# Patient Record
Sex: Female | Born: 1985 | Race: White | Hispanic: No | Marital: Single | Smoking: Current every day smoker
Health system: Southern US, Community
[De-identification: ages and names within clinical notes are randomized; demographics above are authoritative.]

## PROBLEM LIST (undated history)

## (undated) DIAGNOSIS — F32A Depression, unspecified: Secondary | ICD-10-CM

## (undated) DIAGNOSIS — A6000 Herpesviral infection of urogenital system, unspecified: Secondary | ICD-10-CM

## (undated) DIAGNOSIS — F419 Anxiety disorder, unspecified: Secondary | ICD-10-CM

## (undated) DIAGNOSIS — E66813 Obesity, class 3: Secondary | ICD-10-CM

## (undated) DIAGNOSIS — R7303 Prediabetes: Secondary | ICD-10-CM

## (undated) DIAGNOSIS — E282 Polycystic ovarian syndrome: Secondary | ICD-10-CM

## (undated) HISTORY — DX: Polycystic ovarian syndrome: E28.2

## (undated) HISTORY — PX: KNEE SURGERY: SHX244

## (undated) HISTORY — PX: NO PAST SURGERIES: SHX2092

## (undated) HISTORY — DX: Prediabetes: R73.03

## (undated) HISTORY — DX: Morbid (severe) obesity due to excess calories: E66.01

## (undated) HISTORY — DX: Obesity, class 3: E66.813

---
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
## 2007-10-21 ENCOUNTER — Emergency Department: Payer: Self-pay | Admitting: Emergency Medicine

## 2008-06-22 ENCOUNTER — Emergency Department: Payer: Self-pay

## 2008-07-07 ENCOUNTER — Emergency Department: Payer: Self-pay | Admitting: Emergency Medicine

## 2010-03-27 ENCOUNTER — Emergency Department: Payer: Self-pay | Admitting: Emergency Medicine

## 2010-09-27 ENCOUNTER — Emergency Department: Payer: Self-pay | Admitting: Emergency Medicine

## 2011-01-09 ENCOUNTER — Emergency Department: Payer: Self-pay | Admitting: Unknown Physician Specialty

## 2011-03-01 ENCOUNTER — Emergency Department: Payer: Self-pay | Admitting: Emergency Medicine

## 2011-06-12 ENCOUNTER — Emergency Department: Payer: Self-pay | Admitting: Internal Medicine

## 2011-10-21 ENCOUNTER — Emergency Department: Payer: Self-pay | Admitting: *Deleted

## 2012-03-25 ENCOUNTER — Emergency Department: Payer: Self-pay | Admitting: Emergency Medicine

## 2012-06-20 ENCOUNTER — Emergency Department: Payer: Self-pay | Admitting: Emergency Medicine

## 2013-02-25 ENCOUNTER — Ambulatory Visit: Payer: Self-pay | Admitting: Unknown Physician Specialty

## 2013-11-05 ENCOUNTER — Ambulatory Visit: Payer: Self-pay | Admitting: Physician Assistant

## 2013-11-10 ENCOUNTER — Emergency Department: Payer: Self-pay | Admitting: Internal Medicine

## 2013-11-10 LAB — URINALYSIS, COMPLETE
BILIRUBIN, UR: NEGATIVE
Glucose,UR: NEGATIVE mg/dL (ref 0–75)
KETONE: NEGATIVE
Nitrite: NEGATIVE
PH: 6 (ref 4.5–8.0)
PROTEIN: NEGATIVE
RBC,UR: 951 /HPF (ref 0–5)
Specific Gravity: 1.023 (ref 1.003–1.030)

## 2014-05-31 ENCOUNTER — Ambulatory Visit: Payer: Self-pay | Admitting: Emergency Medicine

## 2015-01-04 ENCOUNTER — Emergency Department
Admit: 2015-01-04 | Disposition: A | Discharge status: Home / Self Care | Payer: Self-pay | Admitting: Emergency Medicine

## 2015-02-25 ENCOUNTER — Emergency Department
Admission: EM | Admit: 2015-02-25 | Discharge: 2015-02-25 | Disposition: A | Discharge status: Home / Self Care | Payer: BLUE CROSS/BLUE SHIELD | Attending: Emergency Medicine | Admitting: Emergency Medicine

## 2015-02-25 ENCOUNTER — Encounter: Payer: Self-pay | Admitting: Emergency Medicine

## 2015-02-25 ENCOUNTER — Emergency Department: Payer: BLUE CROSS/BLUE SHIELD

## 2015-02-25 DIAGNOSIS — Z720 Tobacco use: Secondary | ICD-10-CM | POA: Insufficient documentation

## 2015-02-25 DIAGNOSIS — M25562 Pain in left knee: Secondary | ICD-10-CM | POA: Insufficient documentation

## 2015-02-25 MED ORDER — NAPROXEN 500 MG PO TABS: 500.0000 mg | ORAL_TABLET | Freq: Two times a day (BID) | ORAL | Status: AC

## 2015-02-25 NOTE — Discharge Instructions (Signed)

## 2015-02-25 NOTE — ED Provider Notes (Signed)
CSN: [id number]     Arrival date & time 02/25/15  1216 History   First MD Initiated Contact with Patient 02/25/15 1232     Chief Complaint  Patient presents with  . Knee Pain    L knee pain x 3 days, hx same, no new injury     HPI Comments: 29yo female presents today complaining of left knee pain for the past 3 days. She reports she has a recurrent problem with knee pain over the past 3 years. She has been evaluated in our emergency Department for the same in the past. She has not had a new injury. In fact, she has never had an injury. She has had some mild swelling. She has not tried any over-the-counter medications for her pain.  Patient is a 29 y.o. female presenting with knee pain. The history is provided by the patient.  Knee Pain Location:  Knee Time since incident:  3 days Knee location:  L knee Pain details:    Quality:  Aching   Severity:  Moderate   Onset quality:  Gradual   Timing:  Constant   Progression:  Worsening Chronicity:  Recurrent Dislocation: no   Foreign body present:  No foreign bodies Tetanus status:  Up to date Prior injury to area:  No Relieved by:  None tried Worsened by:  Bearing weight, activity and exercise Ineffective treatments:  None tried Associated symptoms: decreased ROM, stiffness and swelling   Associated symptoms: no fever and no numbness   Risk factors: obesity     History reviewed. No pertinent past medical history. No past surgical history on file. No family history on file. History  Substance Use Topics  . Smoking status: Current Every Day Smoker -- 1.00 packs/day  . Smokeless tobacco: Not on file  . Alcohol Use: No   OB History    No data available     Review of Systems  Constitutional: Negative for fever and chills.  Musculoskeletal: Positive for myalgias, joint swelling, arthralgias, gait problem and stiffness.  Skin: Negative for wound.  Neurological: Negative for weakness and numbness.  All other systems reviewed and  are negative.     Allergies  Review of patient's allergies indicates not on file.  Home Medications   Prior to Admission medications   Medication Sig Start Date End Date Taking? Authorizing Provider  naproxen (NAPROSYN) 500 MG tablet Take 1 tablet (500 mg total) by mouth 2 (two) times daily with a meal. 02/25/15 02/25/16  Wilber Oliphant V, PA-C   BP 114/81 mmHg  Pulse 95  Temp(Src) 98.1 F (36.7 C) (Oral)  Resp 20  Ht  (1.575 m)  Wt 245 lb (111.131 kg)  BMI 44.80 kg/m2  SpO2 96%  LMP  (Within Months) Physical Exam  Constitutional: She is oriented to person, place, and time. She appears well-developed and well-nourished.  Morbidly obese  HENT:  Head: Normocephalic and atraumatic.  Musculoskeletal: She exhibits tenderness.       Left knee: She exhibits decreased range of motion, swelling and bony tenderness. She exhibits no effusion, no deformity, no erythema, no LCL laxity, normal meniscus and no MCL laxity. Tenderness found. Medial joint line tenderness noted.  Stable to varus and valgus stress. Negative anterior and posterior drawer. Negative McMurray's test.  Neurological: She is alert and oriented to person, place, and time. She has normal strength. No sensory deficit.  Skin: Skin is warm, dry and intact. No rash noted.  Psychiatric: She has a normal mood and affect.  Her behavior is normal. Judgment and thought content normal.  Nursing note and vitals reviewed.   ED Course  Procedures (including critical care time) Labs Review Labs Reviewed - No data to display  Imaging Review Dg Knee Complete 4 Views Left  02/25/2015   CLINICAL DATA:  Remote left knee injury approximately 3 years ago, now presenting with 3 day history of left knee pain when standing or walking. No recent injuries. Pain localizing anteriorly and laterally.  EXAM: LEFT KNEE - COMPLETE 4+ VIEW  COMPARISON:  03/25/2012.  FINDINGS: No evidence of acute fracture or dislocation. Well-preserved joint spaces.  Well-preserved bone mineral density. No intrinsic osseous abnormality. No visible joint effusion.  IMPRESSION: Normal examination.   Electronically Signed   By: Hulan Saashomas  Ermal Haberer M.D.   On: 02/25/2015 13:35     EKG Interpretation None      MDM  Chronic knee pain. Prescription written for naproxen twice a day. Follow-up for persistent or worsening symptoms with orthopedics. May need MRI for further evaluation given chronicity of symptoms. Patient would greatly benefit from my office. Final diagnoses:  Left knee pain        Luvenia Reddenmma Weavil V, PA-C 02/25/15 1341  Governor Rooksebecca Lord, MD 02/27/15 [id number]

## 2015-02-25 NOTE — ED Notes (Signed)
States has had knee pain x 3 years, recent episode x 3 days without new injury

## 2015-09-11 ENCOUNTER — Ambulatory Visit
Admission: EM | Admit: 2015-09-11 | Discharge: 2015-09-11 | Disposition: A | Discharge status: Home / Self Care | Payer: BLUE CROSS/BLUE SHIELD | Attending: Family Medicine | Admitting: Family Medicine

## 2015-09-11 ENCOUNTER — Encounter: Payer: Self-pay | Admitting: Emergency Medicine

## 2015-09-11 DIAGNOSIS — B9689 Other specified bacterial agents as the cause of diseases classified elsewhere: Secondary | ICD-10-CM

## 2015-09-11 DIAGNOSIS — A499 Bacterial infection, unspecified: Secondary | ICD-10-CM | POA: Diagnosis not present

## 2015-09-11 DIAGNOSIS — N760 Acute vaginitis: Secondary | ICD-10-CM

## 2015-09-11 HISTORY — DX: Herpesviral infection of urogenital system, unspecified: A60.00

## 2015-09-11 HISTORY — DX: Anxiety disorder, unspecified: F41.9

## 2015-09-11 LAB — URINALYSIS COMPLETE WITH MICROSCOPIC (ARMC ONLY)
BACTERIA UA: NONE SEEN
Bilirubin Urine: NEGATIVE
Glucose, UA: NEGATIVE mg/dL
HGB URINE DIPSTICK: NEGATIVE
Ketones, ur: NEGATIVE mg/dL
Nitrite: NEGATIVE
PH: 8.5 — AB (ref 5.0–8.0)
Protein, ur: NEGATIVE mg/dL
RBC / HPF: NONE SEEN RBC/hpf (ref 0–5)
Specific Gravity, Urine: 1.02 (ref 1.005–1.030)
WBC UA: NONE SEEN WBC/hpf (ref 0–5)

## 2015-09-11 LAB — WET PREP, GENITAL
Sperm: NONE SEEN
TRICH WET PREP: NONE SEEN
Yeast Wet Prep HPF POC: NONE SEEN

## 2015-09-11 LAB — PREGNANCY, URINE: Preg Test, Ur: NEGATIVE

## 2015-09-11 MED ORDER — METRONIDAZOLE 500 MG PO TABS: 500.0000 mg | ORAL_TABLET | Freq: Two times a day (BID) | ORAL | Status: DC

## 2015-09-11 NOTE — ED Notes (Signed)
Patient c/o smelling odor and vaginal discharge for 2 weeks.  Patient denies abdominal pain.

## 2015-09-11 NOTE — Discharge Instructions (Signed)
Take medication as prescribed. Drink plenty of fluids. Follow up with your primary care physician or the above this week as needed.Return to Urgent care as needed for new or worsening concerns.   Bacterial Vaginosis Bacterial vaginosis is a vaginal infection that occurs when the normal balance of bacteria in the vagina is disrupted. It results from an overgrowth of certain bacteria. This is the most common vaginal infection in women of childbearing age. Treatment is important to prevent complications, especially in pregnant women, as it can cause a premature delivery. CAUSES  Bacterial vaginosis is caused by an increase in harmful bacteria that are normally present in smaller amounts in the vagina. Several different kinds of bacteria can cause bacterial vaginosis. However, the reason that the condition develops is not fully understood. RISK FACTORS Certain activities or behaviors can put you at an increased risk of developing bacterial vaginosis, including:  Having a new sex partner or multiple sex partners.  Douching.  Using an intrauterine device (IUD) for contraception. Women do not get bacterial vaginosis from toilet seats, bedding, swimming pools, or contact with objects around them. SIGNS AND SYMPTOMS  Some women with bacterial vaginosis have no signs or symptoms. Common symptoms include:  Grey vaginal discharge.  A fishlike odor with discharge, especially after sexual intercourse.  Itching or burning of the vagina and vulva.  Burning or pain with urination. DIAGNOSIS  Your health care provider will take a medical history and examine the vagina for signs of bacterial vaginosis. A sample of vaginal fluid may be taken. Your health care provider will look at this sample under a microscope to check for bacteria and abnormal cells. A vaginal pH test may also be done.  TREATMENT  Bacterial vaginosis may be treated with antibiotic medicines. These may be given in the form of a pill or a  vaginal cream. A second round of antibiotics may be prescribed if the condition comes back after treatment. Because bacterial vaginosis increases your risk for sexually transmitted diseases, getting treated can help reduce your risk for chlamydia, gonorrhea, HIV, and herpes. HOME CARE INSTRUCTIONS   Only take over-the-counter or prescription medicines as directed by your health care provider.  If antibiotic medicine was prescribed, take it as directed. Make sure you finish it even if you start to feel better.  Tell all sexual partners that you have a vaginal infection. They should see their health care provider and be treated if they have problems, such as a mild rash or itching.  During treatment, it is important that you follow these instructions:  Avoid sexual activity or use condoms correctly.  Do not douche.  Avoid alcohol as directed by your health care provider.  Avoid breastfeeding as directed by your health care provider. SEEK MEDICAL CARE IF:   Your symptoms are not improving after 3 days of treatment.  You have increased discharge or pain.  You have a fever. MAKE SURE YOU:   Understand these instructions.  Will watch your condition.  Will get help right away if you are not doing well or get worse. FOR MORE INFORMATION  Centers for Disease Control and Prevention, Division of STD Prevention: SolutionApps.co.zawww.cdc.gov/std American Sexual Health Association (ASHA): www.ashastd.org    This information is not intended to replace advice given to you by your health care provider. Make sure you discuss any questions you have with your health care provider.   Document Released: 09/23/2005 Document Revised: 10/14/2014 Document Reviewed: 05/05/2013 Elsevier Interactive Patient Education [employer]2016 Elsevier Inc.

## 2015-09-11 NOTE — ED Provider Notes (Signed)
[facility]  ____________________________________________  Time seen: Approximately 4:46 PM  I have reviewed the triage vital signs and the nursing notes.   HISTORY  Chief Complaint Vaginal Discharge   HPI Amber Keller is a 29 y.o. female  presents for the complaints of 2 weeks of vaginal discharge. Patient reports occasional odor with vaginal discharge and greenish coloration. Patient reports that she is currently sexually active with one partner, and denies recent partner changes. Patient denies concern for sexual transmitted diseases.  States that she does not use sexual protection and states she is okay if she becomes pregnant.  Denies abdominal pain, fever, vaginal pain, back pain, abnormal vaginal bleeding or other complaints. Denies other complaints.   Past Medical History  Diagnosis Date  . Genital herpes   . Anxiety     There are no active problems to display for this patient.   History reviewed. No pertinent past surgical history.  Current Outpatient Rx  Name  Route  Sig  Dispense  Refill  . acyclovir (ZOVIRAX) 400 MG tablet   Oral   Take 400 mg by mouth as needed.         Marland Kitchen. PARoxetine (PAXIL) 10 MG tablet   Oral   Take 10 mg by mouth daily.         . naproxen (NAPROSYN) 500 MG tablet   Oral   Take 1 tablet (500 mg total) by mouth 2 (two) times daily with a meal.   60 tablet   0     Allergies Review of patient's allergies indicates no known allergies.  History reviewed. No pertinent family history.  Social History Social History  Substance Use Topics  . Smoking status: Current Every Day Smoker -- 1.00 packs/day  . Smokeless tobacco: None  . Alcohol Use: No    Review of Systems Constitutional: No fever/chills Eyes: No visual changes. ENT: No sore throat. Cardiovascular: Denies chest pain. Respiratory: Denies shortness of breath. Gastrointestinal: No abdominal pain.  No nausea, no vomiting.  No diarrhea.  No  constipation. Genitourinary: Negative for dysuria. Positive vaginal discharge. Musculoskeletal: Negative for back pain. Skin: Negative for rash. Neurological: Negative for headaches, focal weakness or numbness.  10-point ROS otherwise negative.  ____________________________________________   PHYSICAL EXAM:  VITAL SIGNS: ED Triage Vitals  Enc Vitals Group     BP 09/11/15 1539 127/80 mmHg     Pulse Rate 09/11/15 1539 70     Resp 09/11/15 1539 16     Temp 09/11/15 1539 98.3 F (36.8 C)     Temp Source 09/11/15 1539 Tympanic     SpO2 09/11/15 1539 99 %     Weight 09/11/15 1539 350 lb (158.759 kg)     Height 09/11/15 1539 5\' 2"  (1.575 m)     Head Cir --      Peak Flow --      Pain Score --      Pain Loc --      Pain Edu? --      Excl. in GC? --     Constitutional: Alert and oriented. Well appearing and in no acute distress. Eyes: Conjunctivae are normal. PERRL. EOMI. Head: Atraumatic.  Nose: No congestion/rhinnorhea.  Mouth/Throat: Mucous membranes are moist.  Oropharynx non-erythematous. Neck: No stridor.  No cervical spine tenderness to palpation. Hematological/Lymphatic/Immunilogical: No cervical lymphadenopathy. Cardiovascular: Normal rate, regular rhythm. Grossly normal heart sounds.  Good peripheral circulation. Respiratory: Normal respiratory effort.  No retractions. Lungs CTAB. Gastrointestinal: Soft and nontender. No distention.  Normal Bowel sounds.  No abdominal bruits. No CVA tenderness. Pelvic exam completed with Heather RN at bedside. External: Normal appearance, no rash or lesions. Speculum: Mild to moderate amount of greenish mucousy discharge, cervix os closed, no active vaginal bleeding and no blood noted in vaginal vault. Bimanual: No cervical or adnexal tenderness, nontender.  Musculoskeletal: No lower or upper extremity tenderness nor edema.  No joint effusions. Bilateral pedal pulses equal and easily palpated.  Neurologic:  Normal speech and language. No  gross focal neurologic deficits are appreciated. No gait instability. Skin:  Skin is warm, dry and intact. No rash noted. Psychiatric: Mood and affect are normal. Speech and behavior are normal.  ____________________________________________   LABS (all labs ordered are listed, but only abnormal results are displayed)  Labs Reviewed  WET PREP, GENITAL - Abnormal; Notable for the following:    Clue Cells Wet Prep HPF POC FEW (*)    WBC, Wet Prep HPF POC MODERATE (*)    All other components within normal limits  URINALYSIS COMPLETEWITH MICROSCOPIC ([facility] ONLY) - Abnormal; Notable for the following:    APPearance TURBID (*)    pH 8.5 (*)    Leukocytes, UA TRACE (*)    Squamous Epithelial / LPF 6-30 (*)    All other components within normal limits  CHLAMYDIA/NGC RT PCR ([facility] ONLY)  URINE CULTURE  PREGNANCY, URINE     INITIAL IMPRESSION / ASSESSMENT AND PLAN / ED COURSE  Pertinent labs & imaging results that were available during my care of the patient were reviewed by me and considered in my medical decision making (see chart for details).  Well-appearing patient. No acute distress. Presents for complaints of 2 weeks of vaginal discharge. Denies other complaints. Denies known trigger. Reports sexual active with 1 partner. Urinalysis negative for bacteria, trace leukocytes, will culture urine. Wet prep completed as well as gonorrhea chlamydia swabs obtained.  Wet prep positive for clue cells, and wbcs. Will treat bacterial vaginosis with oral flagyl and PCP follow up . OBGYN follow up information also given for OBGYN on call Dr Valentino Saxon. Discussed follow up with Primary care physician this week. Discussed follow up and return parameters including no resolution or any worsening concerns. Patient verbalized understanding and agreed to plan.   ____________________________________________    FINAL CLINICAL IMPRESSION(S) / ED DIAGNOSES  Final diagnoses:  Bacterial vaginosis        Renford Dills, NP 09/11/15 1711

## 2015-09-12 LAB — CHLAMYDIA/NGC RT PCR (ARMC ONLY)
CHLAMYDIA TR: NOT DETECTED
N gonorrhoeae: NOT DETECTED

## 2015-09-13 LAB — URINE CULTURE

## 2015-10-05 ENCOUNTER — Encounter: Payer: Self-pay | Admitting: Emergency Medicine

## 2015-10-05 ENCOUNTER — Ambulatory Visit (INDEPENDENT_AMBULATORY_CARE_PROVIDER_SITE_OTHER): Payer: BLUE CROSS/BLUE SHIELD

## 2015-10-05 ENCOUNTER — Ambulatory Visit
Admission: EM | Admit: 2015-10-05 | Discharge: 2015-10-05 | Disposition: A | Discharge status: Home / Self Care | Payer: BLUE CROSS/BLUE SHIELD | Attending: Family Medicine | Admitting: Family Medicine

## 2015-10-05 DIAGNOSIS — J3489 Other specified disorders of nose and nasal sinuses: Secondary | ICD-10-CM

## 2015-10-05 DIAGNOSIS — J40 Bronchitis, not specified as acute or chronic: Secondary | ICD-10-CM | POA: Diagnosis not present

## 2015-10-05 MED ORDER — FEXOFENADINE-PSEUDOEPHED ER 180-240 MG PO TB24: 1.0000 | ORAL_TABLET | Freq: Every day | ORAL | Status: DC

## 2015-10-05 MED ORDER — FLUTICASONE PROPIONATE 50 MCG/ACT NA SUSP: 2.0000 | Freq: Every day | NASAL | Status: DC

## 2015-10-05 MED ORDER — AMOXICILLIN-POT CLAVULANATE 875-125 MG PO TABS: 1.0000 | ORAL_TABLET | Freq: Two times a day (BID) | ORAL | Status: DC

## 2015-10-05 MED ORDER — HYDROCOD POLST-CPM POLST ER 10-8 MG/5ML PO SUER: 5.0000 mL | Freq: Two times a day (BID) | ORAL | Status: DC | PRN

## 2015-10-05 NOTE — Discharge Instructions (Signed)

## 2015-10-05 NOTE — ED Provider Notes (Signed)
CSN: [id number]     Arrival date & time 10/05/15  [id number] History   First MD Initiated Contact with Patient 10/05/15 [phone]     Nurses notes were reviewed.  Chief Complaint  Patient presents with  . Cough   Patient reports having a cough for over 3 months. She states the last 3 weeks she's had postnasal drainage is greenish material coming from her nostril. She reports feeling miserable. 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 Question as far as the duration of her illness but she is insistent that she's been coughing for about 3 months and the nasal congestion for at least 3 weeks. She states is greenish thick material that she's coughing up. She states states that while no fever was found today she felt that she has been running a fever. She does not smoke but her father does smoke around her.  In reviewing patient's records she does have a history of smoking after all.  (Consider location/radiation/quality/duration/timing/severity/associated sxs/prior Treatment) Patient is a 29 y.o. female presenting with URI. The history is provided by the patient. No language interpreter was used.  URI Presenting symptoms: cough, ear pain and rhinorrhea   Severity:  Moderate Onset quality:  Sudden Timing:  Constant Progression:  Resolved Chronicity:  New Relieved by:  Breathing Worsened by:  Nothing tried Associated symptoms: no headaches and no sinus pain   Risk factors: no immunosuppression     Past Medical History  Diagnosis Date  . Genital herpes   . Anxiety    History reviewed. No pertinent past surgical history. History reviewed. No pertinent family history. Social History  Substance Use Topics  . Smoking status: Current Every Day Smoker -- 1.00 packs/day  . Smokeless tobacco: None  . Alcohol Use: No   OB History    No data available     Review of Systems  Constitutional: Positive for activity change.  HENT:  Positive for ear pain, postnasal drip, rhinorrhea and voice change.   Respiratory: Positive for cough and shortness of breath.   Neurological: Negative for headaches.  All other systems reviewed and are negative.   Allergies  Review of patient's allergies indicates no known allergies.  Home Medications   Prior to Admission medications   Medication Sig Start Date End Date Taking? Authorizing Provider  acyclovir (ZOVIRAX) 400 MG tablet Take 400 mg by mouth as needed.    Historical Provider, MD  amoxicillin-clavulanate (AUGMENTIN) 875-125 MG tablet Take 1 tablet by mouth 2 (two) times daily. 10/05/15   Hassan RowanEugene Katsumi Wisler, MD  chlorpheniramine-HYDROcodone [facility](TUSSIONEX PENNKINETIC ER) 10-8 MG/5ML SUER Take 5 mLs by mouth every 12 (twelve) hours as needed for cough. 10/05/15   Hassan RowanEugene Emanuel Campos, MD  fexofenadine-pseudoephedrine (ALLEGRA-D ALLERGY & CONGESTION) 180-240 MG 24 hr tablet Take 1 tablet by mouth daily. 10/05/15   Hassan RowanEugene Arvilla Salada, MD  fluticasone (FLONASE) 50 MCG/ACT nasal spray Place 2 sprays into both nostrils daily. 10/05/15   Hassan RowanEugene Lean Jaeger, MD  metroNIDAZOLE (FLAGYL) 500 MG tablet Take 1 tablet (500 mg total) by mouth 2 (two) times daily. 09/11/15   Renford DillsLindsey Miller, NP  naproxen (NAPROSYN) 500 MG tablet Take 1 tablet (500 mg total) by mouth 2 (two) times daily with a meal. 02/25/15 02/25/16  Christella ScheuermannEmma Lawrence V, PA-C  PARoxetine (PAXIL) 10 MG tablet Take 10 mg by mouth daily.    Historical Provider, MD   Meds Ordered and Administered this Visit  Medications - No  data to display  BP 113/72 mmHg  Pulse 74  Temp(Src) 97 F (36.1 C) (Tympanic)  Resp 17  Ht  (1.575 m)  Wt 350 lb (158.759 kg)  BMI 64.00 kg/m2  SpO2 99%  LMP 08/03/2015 (Approximate) No data found.   Physical Exam  Constitutional: She is oriented to person, place, and time. She appears well-developed and well-nourished.  HENT:  Head: Normocephalic and atraumatic.  Right Ear: Hearing, tympanic membrane, external ear and ear canal  normal.  Left Ear: Hearing, tympanic membrane, external ear and ear canal normal.  Nose: Mucosal edema and rhinorrhea present. Right sinus exhibits maxillary sinus tenderness. Left sinus exhibits maxillary sinus tenderness.  Mouth/Throat: Posterior oropharyngeal erythema present.  Eyes: Conjunctivae are normal. Pupils are equal, round, and reactive to light.  Neck: Neck supple. No tracheal deviation present.  Cardiovascular: Normal rate, regular rhythm and normal heart sounds.   Pulmonary/Chest: Effort normal and breath sounds normal. No respiratory distress. She has no wheezes.  Musculoskeletal: Normal range of motion.  Lymphadenopathy:    She has cervical adenopathy.  Neurological: She is alert and oriented to person, place, and time.  Skin: Skin is warm and dry.  Psychiatric: She has a normal mood and affect.  Vitals reviewed.   ED Course  Procedures (including critical care time)  Labs Review Labs Reviewed - No data to display  Imaging Review Dg Chest 2 View  10/05/2015  CLINICAL DATA:  Productive cough with chills for 3 months. EXAM: CHEST  2 VIEW COMPARISON:  None. FINDINGS: The heart size and mediastinal contours are within normal limits. Both lungs are clear. The visualized skeletal structures are unremarkable. IMPRESSION: No active cardiopulmonary disease. Electronically Signed   By: Charlett Nose M.D.   On: 10/05/2015 10:48     Visual Acuity Review  Right Eye Distance:   Left Eye Distance:   Bilateral Distance:    Right Eye Near:   Left Eye Near:    Bilateral Near:         MDM   1. Bronchitis   2. Nasal and sinus discharge    At this time chest x-rays negative. Concerned because of her severity of symptoms that she's complained about. Physically she does not look bad. Will give her work note for today. 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 This hopefully will allow her to get some rest and sleep. Please see her PCP if not better in 1-2 weeks. She will be sent home on Tussionex, Allegra-D, Flonase nasal spray, and Augmentin.  The discrepancy was also noted in regards to her smoking history.    Hassan Rowan, MD 10/05/15 [phone]

## 2015-10-05 NOTE — ED Notes (Signed)
Patient cough and chest congestion for 3 months.

## 2016-08-23 ENCOUNTER — Emergency Department
Admission: EM | Admit: 2016-08-23 | Discharge: 2016-08-23 | Disposition: A | Discharge status: Home / Self Care | Payer: Managed Care, Other (non HMO) | Attending: Emergency Medicine | Admitting: Emergency Medicine

## 2016-08-23 ENCOUNTER — Encounter: Payer: Self-pay | Admitting: Emergency Medicine

## 2016-08-23 DIAGNOSIS — F17200 Nicotine dependence, unspecified, uncomplicated: Secondary | ICD-10-CM | POA: Insufficient documentation

## 2016-08-23 DIAGNOSIS — F419 Anxiety disorder, unspecified: Secondary | ICD-10-CM | POA: Diagnosis not present

## 2016-08-23 DIAGNOSIS — M79672 Pain in left foot: Secondary | ICD-10-CM

## 2016-08-23 MED ORDER — TRAMADOL HCL 50 MG PO TABS: 50.0000 mg | ORAL_TABLET | Freq: Four times a day (QID) | ORAL | 0 refills | Status: DC | PRN

## 2016-08-23 NOTE — ED Provider Notes (Signed)
[facility] Emergency Department Provider Note   ____________________________________________   First MD Initiated Contact with Patient 08/23/16 1116     (approximate)  I have reviewed the triage vital signs and the nursing notes.   HISTORY  Chief Complaint Foot Pain    HPI Amber Zenaida Nieceicole Keller is a 30 y.o. female patient complaining left dorsal foot pain for 2 weeks which has increased in the last few days.. Patient denies any injury or provocative incident for her complaint. Patient states she does have a history of plantar fasciitis with heel spurs. Patient states she currently takes naproxen on an as-needed basis for her plantar fasciitis. Patient is rating her pain as a 8/10. Patient describes the pain as achy. No other palliative measures for her complaint.   Past Medical History:  Diagnosis Date  . Anxiety   . Genital herpes     There are no active problems to display for this patient.   History reviewed. No pertinent surgical history.  Prior to Admission medications   Medication Sig Start Date End Date Taking? Authorizing Provider  acyclovir (ZOVIRAX) 400 MG tablet Take 400 mg by mouth as needed.    Historical Provider, MD  amoxicillin-clavulanate (AUGMENTIN) 875-125 MG tablet Take 1 tablet by mouth 2 (two) times daily. 10/05/15   Hassan RowanEugene Wade, MD  chlorpheniramine-HYDROcodone [facility](TUSSIONEX PENNKINETIC ER) 10-8 MG/5ML SUER Take 5 mLs by mouth every 12 (twelve) hours as needed for cough. 10/05/15   Hassan RowanEugene Wade, MD  fexofenadine-pseudoephedrine (ALLEGRA-D ALLERGY & CONGESTION) 180-240 MG 24 hr tablet Take 1 tablet by mouth daily. 10/05/15   Hassan RowanEugene Wade, MD  fluticasone (FLONASE) 50 MCG/ACT nasal spray Place 2 sprays into both nostrils daily. 10/05/15   Hassan RowanEugene Wade, MD  metroNIDAZOLE (FLAGYL) 500 MG tablet Take 1 tablet (500 mg total) by mouth 2 (two) times daily. 09/11/15   Renford DillsLindsey Miller, NP  PARoxetine (PAXIL) 10 MG tablet Take 10 mg by mouth  daily.    Historical Provider, MD  traMADol (ULTRAM) 50 MG tablet Take 1 tablet (50 mg total) by mouth every 6 (six) hours as needed for moderate pain. 08/23/16   Joni Reiningonald K Evalene Vath, PA-C    Allergies Patient has no known allergies.  No family history on file.  Social History Social History  Substance Use Topics  . Smoking status: Current Every Day Smoker    Packs/day: 1.00  . Smokeless tobacco: Not on file  . Alcohol use No    Review of Systems Constitutional: No fever/chills.  Eyes: No visual changes. ENT: No sore throat. Cardiovascular: Denies chest pain. Respiratory: Denies shortness of breath. Gastrointestinal: No abdominal pain.  No nausea, no vomiting.  No diarrhea.  No constipation. Genitourinary: Negative for dysuria. Musculoskeletal: Negative for back pain. Skin: Negative for rash. Neurological: Negative for headaches, focal weakness or numbness. Psychiatric:Anxiety  ____________________________________________   PHYSICAL EXAM:  VITAL SIGNS: ED Triage Vitals [08/23/16 1107]  Enc Vitals Group     BP 122/82     Pulse Rate 83     Resp 18     Temp 97.1 F (36.2 C)     Temp Source Oral     SpO2 97 %     Weight 260 lb (117.9 kg)     Height 5\' 2"  (1.575 m)     Head Circumference      Peak Flow      Pain Score 8     Pain Loc      Pain Edu?  Excl. in GC?     Constitutional: Alert and oriented. Well appearing and in no acute distress. Eyes: Conjunctivae are normal. PERRL. EOMI. Head: Atraumatic. Nose: No congestion/rhinnorhea. Mouth/Throat: Mucous membranes are moist.  Oropharynx non-erythematous. Neck: No stridor.  No cervical spine tenderness to palpation. Hematological/Lymphatic/Immunilogical: No cervical lymphadenopathy. Cardiovascular: Normal rate, regular rhythm. Grossly normal heart sounds.  Good peripheral circulation. Respiratory: Normal respiratory effort.  No retractions. Lungs CTAB. Gastrointestinal: Soft and nontender. No distention. No  abdominal bruits. No CVA tenderness. Musculoskeletal: No obvious deformity to the left foot. There is no edema or erythema. Patient is nontender palpation this time. She has full nuchal range of motion of the foot. Patient weightbears without discomfort.  Neurologic:  Normal speech and language. No gross focal neurologic deficits are appreciated. No gait instability. Skin:  Skin is warm, dry and intact. No rash noted. Psychiatric: Mood and affect are normal. Speech and behavior are normal.  ____________________________________________   LABS (all labs ordered are listed, but only abnormal results are displayed)  Labs Reviewed - No data to display ____________________________________________  EKG   ____________________________________________  RADIOLOGY   ____________________________________________   PROCEDURES  Procedure(s) performed: None  Procedures  Critical Care performed: No  ____________________________________________   INITIAL IMPRESSION / ASSESSMENT AND PLAN / ED COURSE  Pertinent labs & imaging results that were available during my care of the patient were reviewed by me and considered in my medical decision making (see chart for details).  Left foot pain. Patient given discharge Instructions advised follow-up with podiatry for definitive evaluation and treatment. Patient given a work note for today.  Clinical Course      ____________________________________________   FINAL CLINICAL IMPRESSION(S) / ED DIAGNOSES  Final diagnoses:  Foot pain, left      NEW MEDICATIONS STARTED DURING THIS VISIT:  New Prescriptions   TRAMADOL (ULTRAM) 50 MG TABLET    Take 1 tablet (50 mg total) by mouth every 6 (six) hours as needed for moderate pain.     Note:  This document was prepared using Dragon voice recognition software and may include unintentional dictation errors.    Joni Reiningonald K Emric Kowalewski, PA-C 08/23/16 1128    Sharyn CreamerMark Quale, MD 08/23/16 1452

## 2016-08-23 NOTE — ED Triage Notes (Signed)
Pt reports pain to top of left foot for a few days, denies injury.

## 2017-04-02 ENCOUNTER — Encounter: Payer: Self-pay | Admitting: Obstetrics and Gynecology

## 2017-04-21 ENCOUNTER — Encounter: Payer: Self-pay | Admitting: Obstetrics and Gynecology

## 2017-04-21 ENCOUNTER — Ambulatory Visit (INDEPENDENT_AMBULATORY_CARE_PROVIDER_SITE_OTHER): Payer: Managed Care, Other (non HMO) | Admitting: Obstetrics and Gynecology

## 2017-04-21 VITALS — BP 118/82 | HR 82 | Ht 62.0 in | Wt 240.0 lb

## 2017-04-21 DIAGNOSIS — R7303 Prediabetes: Secondary | ICD-10-CM | POA: Diagnosis not present

## 2017-04-21 DIAGNOSIS — E282 Polycystic ovarian syndrome: Secondary | ICD-10-CM

## 2017-04-21 DIAGNOSIS — Z3169 Encounter for other general counseling and advice on procreation: Secondary | ICD-10-CM

## 2017-04-21 NOTE — Progress Notes (Signed)
Chief Complaint  Patient presents with  . abnormal uterine bleeding    HPI:      Amber Keller is a 31 y.o. G0P0000 who LMP was Patient's last menstrual period was 02/11/2017 (exact date)., presents today for NP conference re: AUB. Pt has a hx of PCOS, diagnosed by PCP. Pt will go months without a period and then have DUB sx. Pt was started on OCPs a couple months ago after DUB sx started. Pt went back to PCP for f/u but saw someone different. She wanted same pills but was given a different Rx. She is now on junel 1/20 and has had BTB/period bleeding for whole pill pack. She is now on placebo pills. She is frustrated with bleeding sx and wants it to stop.   She also would like to [facility] and [facility]. Her current partner has 4 kids, the youngest 2 yrs old. She has never been pregnant. She had monthly menses at menarche but then started having oligomenorrhea in high school when she weighed over 200#. 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 She tried phentermine for a month for wt loss and has lost over 20#. She wants to continue this med and needs to f/u with provider for Rx RF.    Patient Active Problem List   Diagnosis Date Noted  . PCOS (polycystic ovarian syndrome)   . Prediabetes   . Obesity, Class III, BMI 40-49.9 (morbid obesity) (HCC)     Family History  Problem Relation Age of Onset  . ALS Paternal Aunt   . Lung cancer Paternal Grandfather     Social History   Social History  . Marital status: Single    Spouse name: N/A  . Number of children: N/A  . Years of education: N/A   Occupational History  . Not on file.   Social History Main Topics  . Smoking status: Current Every Day Smoker    Packs/day: 1.00  . Smokeless tobacco: Never Used  . Alcohol use No  . Drug use: No  . Sexual activity: Yes    Birth control/ protection: Pill   Other Topics Concern  .  Not on file   Social History Narrative  . No narrative on file     Current Outpatient Prescriptions:  .  acyclovir (ZOVIRAX) 400 MG tablet, Take 2 tablets by mouth three times daily for two days., Disp: , Rfl:  .  JUNEL FE 1/20 1-20 MG-MCG tablet, TK 1 T PO QD, Disp: , Rfl: 3 .  metFORMIN (GLUCOPHAGE) 500 MG tablet, Take by mouth., Disp: , Rfl:  .  naproxen (NAPROSYN) 500 MG tablet, Take by mouth., Disp: , Rfl:  .  phentermine (ADIPEX-P) 37.5 MG tablet, TK ONE T PO QAM B BRE, Disp: , Rfl: 0  Review of Systems  Constitutional: Negative for fever.  Gastrointestinal: Negative for blood in stool, constipation, diarrhea, nausea and vomiting.  Genitourinary: Positive for menstrual problem and vaginal bleeding. Negative for dyspareunia, dysuria, flank pain, frequency, hematuria, urgency, vaginal discharge and vaginal pain.  Musculoskeletal: Negative for back pain.  Skin: Negative for rash.     OBJECTIVE:   Vitals:  BP 118/82   Pulse 82   Ht 5\' 2"  (1.575 m)   Wt 240 lb (108.9 kg)   LMP 02/11/2017 (Exact Date) Comment: consistent bleeding since LMP  BMI 43.90 kg/m   Physical Exam  Constitutional: She is oriented to person, place, and time and well-developed, well-nourished, and in no distress.  Pulmonary/Chest: Effort normal.  Musculoskeletal: Normal range of motion.  Neurological: She is alert and oriented to person, place, and time.  Psychiatric: Mood, memory, affect and judgment normal.  Nursing note and vitals reviewed.    Assessment/Plan: PCOS (polycystic ovarian syndrome) - Discussed etiology, tx, mgmt, importance of wt loss and tx. Encouraged cont wt loss, followed by stopping OCPs to see if menses resume to monthly for conceptio   Prediabetes - Pt on metformin with PCP.  Obesity, Class III, BMI 40-49.9 (morbid obesity) (HCC)  Pre-conception counseling - Discussed metformin/clomid tx. Ideally, pt loses wt for health pregnancy and to see if can have regular ovulatory  bleeding. F/u wiht MD prn.     Return if symptoms worsen or fail to improve.  Alicia B. Copland, PA-C 04/21/2017 4:36 PM

## 2017-08-11 ENCOUNTER — Emergency Department
Admission: EM | Admit: 2017-08-11 | Discharge: 2017-08-11 | Disposition: A | Discharge status: Home / Self Care | Payer: Managed Care, Other (non HMO) | Attending: Emergency Medicine | Admitting: Emergency Medicine

## 2017-08-11 ENCOUNTER — Encounter: Payer: Self-pay | Admitting: Emergency Medicine

## 2017-08-11 ENCOUNTER — Other Ambulatory Visit: Payer: Self-pay

## 2017-08-11 DIAGNOSIS — Z79899 Other long term (current) drug therapy: Secondary | ICD-10-CM | POA: Diagnosis not present

## 2017-08-11 DIAGNOSIS — M62838 Other muscle spasm: Secondary | ICD-10-CM | POA: Insufficient documentation

## 2017-08-11 DIAGNOSIS — M5410 Radiculopathy, site unspecified: Secondary | ICD-10-CM | POA: Diagnosis not present

## 2017-08-11 DIAGNOSIS — M545 Low back pain: Secondary | ICD-10-CM | POA: Diagnosis present

## 2017-08-11 DIAGNOSIS — F17200 Nicotine dependence, unspecified, uncomplicated: Secondary | ICD-10-CM | POA: Insufficient documentation

## 2017-08-11 DIAGNOSIS — M62830 Muscle spasm of back: Secondary | ICD-10-CM

## 2017-08-11 MED ORDER — MELOXICAM 15 MG PO TABS: 15.0000 mg | ORAL_TABLET | Freq: Every day | ORAL | 0 refills | Status: DC

## 2017-08-11 MED ORDER — METHOCARBAMOL 500 MG PO TABS: 500.0000 mg | ORAL_TABLET | Freq: Four times a day (QID) | ORAL | 0 refills | Status: DC

## 2017-08-11 NOTE — ED Provider Notes (Signed)
[facility] Emergency Department Provider Note  ____________________________________________  Time seen: Approximately 6:39 PM  I have reviewed the triage vital signs and the nursing notes.   HISTORY  Chief Complaint Back Pain    HPI Amber Keller is a 31 y.o. female who presents the emergency department complaining of left lower sided back pain radiating to the left hip.  Symptoms have been ongoing times 1 week.  No precipitating trauma.  Patient denies any urinary symptoms of dysuria, polyuria, hematuria.  Patient denies any abdominal pain.  No history of kidney stones.  No fevers or chills.  Patient denies any numbness or tingling in the left lower extremity.  Patient has not tried any medications including Tylenol and Motrin.  No history of recurrent back pain.  Past Medical History:  Diagnosis Date  . Anxiety   . Genital herpes   . Obesity, Class III, BMI 40-49.9 (morbid obesity) (HCC)   . PCOS (polycystic ovarian syndrome)   . Prediabetes     Patient Active Problem List   Diagnosis Date Noted  . PCOS (polycystic ovarian syndrome)   . Prediabetes   . Obesity, Class III, BMI 40-49.9 (morbid obesity) (HCC)     Past Surgical History:  Procedure Laterality Date  . NO PAST SURGERIES      Prior to Admission medications   Medication Sig Start Date End Date Taking? Authorizing Provider  acyclovir (ZOVIRAX) 400 MG tablet Take 2 tablets by mouth three times daily for two days. 07/09/16   [provider]  JUNEL FE 1/20 1-20 MG-MCG tablet TK 1 T PO QD 04/02/17   [provider]  meloxicam (MOBIC) 15 MG tablet Take 1 tablet (15 mg total) daily by mouth. 08/11/17   Marcoantonio Legault, Delorise Royals, PA-C  metFORMIN (GLUCOPHAGE) 500 MG tablet Take by mouth. 03/04/17 03/04/18  [provider]  methocarbamol (ROBAXIN) 500 MG tablet Take 1 tablet (500 mg total) 4 (four) times daily by mouth. 08/11/17   Ahmia Colford, Delorise Royals, PA-C  naproxen  (NAPROSYN) 500 MG tablet Take by mouth. 11/14/15   [provider]  phentermine (ADIPEX-P) 37.5 MG tablet TK ONE T PO QAM B BRE 03/04/17   [provider]    Allergies Patient has no known allergies.  Family History  Problem Relation Age of Onset  . ALS Paternal Aunt   . Lung cancer Paternal Grandfather     Social History Social History   Tobacco Use  . Smoking status: Current Every Day Smoker    Packs/day: 1.00  . Smokeless tobacco: Never Used  Substance Use Topics  . Alcohol use: No  . Drug use: No     Review of Systems  Constitutional: No fever/chills Eyes: No visual changes.  Cardiovascular: no chest pain. Respiratory: no cough. No SOB. Gastrointestinal: No abdominal pain.  No nausea, no vomiting.  No diarrhea.  No constipation. Genitourinary: Negative for dysuria. No hematuria Musculoskeletal: Positive for left lower back pain radiating into left hip. Skin: Negative for rash, abrasions, lacerations, ecchymosis. Neurological: Negative for headaches, focal weakness or numbness. 10-point ROS otherwise negative.  ____________________________________________   PHYSICAL EXAM:  VITAL SIGNS: ED Triage Vitals  Enc Vitals Group     BP 08/11/17 1701 (!) 144/84     Pulse Rate 08/11/17 1701 77     Resp 08/11/17 1701 18     Temp 08/11/17 1701 98.6 F (37 C)     Temp Source 08/11/17 1701 Oral     SpO2 08/11/17 1701 100 %  Weight 08/11/17 1700 244 lb (110.7 kg)     Height 08/11/17 1700 5\' 2"  (1.575 m)     Head Circumference --      Peak Flow --      Pain Score 08/11/17 1700 7     Pain Loc --      Pain Edu? --      Excl. in GC? --      Constitutional: Alert and oriented. Well appearing and in no acute distress. Eyes: Conjunctivae are normal. PERRL. EOMI. Head: Atraumatic. Neck: No stridor.  No cervical spine tenderness to palpation.  Cardiovascular: Normal rate, regular rhythm. Normal S1 and S2.  Good peripheral circulation. Respiratory:  Normal respiratory effort without tachypnea or retractions. Lungs CTAB. Good air entry to the bases with no decreased or absent breath sounds. Gastrointestinal: Bowel sounds 4 quadrants. Soft and nontender to palpation. No guarding or rigidity. No palpable masses. No distention. No CVA tenderness. Musculoskeletal: Full range of motion to all extremities. No gross deformities appreciated.  No deformities to spine upon inspection.  Full range of motion.  No tenderness to palpation midline spinal processes.  No palpable abnormality or step-off.  Patient is diffusely tender to palpation over the left paraspinal muscle group.  Spasms are appreciated.  Patient is nontender palpation of the left-sided sciatic notch.  No tenderness to palpation over greater trochanteric region.  Negative straight leg raise.  Dorsalis pedis pulse intact distally.  Sensation intact all dermatomal distributions distally. Neurologic:  Normal speech and language. No gross focal neurologic deficits are appreciated.  Skin:  Skin is warm, dry and intact. No rash noted. Psychiatric: Mood and affect are normal. Speech and behavior are normal. Patient exhibits appropriate insight and judgement.   ____________________________________________   LABS (all labs ordered are listed, but only abnormal results are displayed)  Labs Reviewed - No data to display ____________________________________________  EKG   ____________________________________________  RADIOLOGY   No results found.  ____________________________________________    PROCEDURES  Procedure(s) performed:    Procedures    Medications - No data to display   ____________________________________________   INITIAL IMPRESSION / ASSESSMENT AND PLAN / ED COURSE  Pertinent labs & imaging results that were available during my care of the patient were reviewed by me and considered in my medical decision making (see chart for details).  Review of the [place]  CSRS was performed in accordance of the [facility] prior to dispensing any controlled drugs.     Patient's diagnosis is consistent with lumbar strain with radicular symptoms.  Differential included UTI versus nephrolithiasis versus lumbar strain versus lumbar fracture versus PCOS.  At this time, exam is consistent with lumbar muscle spasms with mild radicular symptoms.  Patient is offered Toradol and Norflex in the emergency department and declines.  Patient will be discharged home with prescriptions for meloxicam and Robaxin. Patient is to follow up with primary care as needed or otherwise directed. Patient is given ED precautions to return to the ED for any worsening or new symptoms.     ____________________________________________  FINAL CLINICAL IMPRESSION(S) / ED DIAGNOSES  Final diagnoses:  Lumbar paraspinal muscle spasm      NEW MEDICATIONS STARTED DURING THIS VISIT:  Meloxicam 15 mg daily Robaxin 500 mg 4 times daily as needed      This chart was dictated using voice recognition software/Dragon. Despite best efforts to proofread, errors can occur which can change the meaning. Any change was purely unintentional.    Racheal PatchesCuthriell, Ally Knodel D, PA-C 08/11/17 1906  Myrna Blazer, MD 08/11/17 2252

## 2017-08-11 NOTE — ED Triage Notes (Signed)
Pt reports low back pain for one week. Ambulatory to triage carrying a mountain dew. No apparent distress noted.

## 2017-11-20 ENCOUNTER — Other Ambulatory Visit: Payer: Self-pay | Admitting: Sports Medicine

## 2017-11-20 DIAGNOSIS — M545 Low back pain, unspecified: Secondary | ICD-10-CM

## 2017-11-20 DIAGNOSIS — M79604 Pain in right leg: Secondary | ICD-10-CM

## 2018-02-11 ENCOUNTER — Other Ambulatory Visit: Payer: Self-pay

## 2018-02-11 ENCOUNTER — Emergency Department
Admission: EM | Admit: 2018-02-11 | Discharge: 2018-02-11 | Disposition: A | Discharge status: Home / Self Care | Payer: Managed Care, Other (non HMO) | Attending: Emergency Medicine | Admitting: Emergency Medicine

## 2018-02-11 ENCOUNTER — Encounter: Payer: Self-pay | Admitting: Emergency Medicine

## 2018-02-11 DIAGNOSIS — M549 Dorsalgia, unspecified: Secondary | ICD-10-CM

## 2018-02-11 DIAGNOSIS — Z7984 Long term (current) use of oral hypoglycemic drugs: Secondary | ICD-10-CM | POA: Insufficient documentation

## 2018-02-11 DIAGNOSIS — N390 Urinary tract infection, site not specified: Secondary | ICD-10-CM | POA: Diagnosis not present

## 2018-02-11 DIAGNOSIS — F17200 Nicotine dependence, unspecified, uncomplicated: Secondary | ICD-10-CM | POA: Diagnosis not present

## 2018-02-11 DIAGNOSIS — Z79899 Other long term (current) drug therapy: Secondary | ICD-10-CM | POA: Diagnosis not present

## 2018-02-11 DIAGNOSIS — R1032 Left lower quadrant pain: Secondary | ICD-10-CM | POA: Diagnosis present

## 2018-02-11 LAB — URINALYSIS, ROUTINE W REFLEX MICROSCOPIC
BACTERIA UA: NONE SEEN
Bilirubin Urine: NEGATIVE
GLUCOSE, UA: NEGATIVE mg/dL
HGB URINE DIPSTICK: NEGATIVE
Ketones, ur: NEGATIVE mg/dL
NITRITE: NEGATIVE
Protein, ur: NEGATIVE mg/dL
SPECIFIC GRAVITY, URINE: 1.026 (ref 1.005–1.030)
pH: 5 (ref 5.0–8.0)

## 2018-02-11 LAB — POCT PREGNANCY, URINE: PREG TEST UR: NEGATIVE

## 2018-02-11 MED ORDER — CEPHALEXIN 500 MG PO CAPS: 500.0000 mg | ORAL_CAPSULE | Freq: Three times a day (TID) | ORAL | 0 refills | Status: AC

## 2018-02-11 MED ORDER — TRAMADOL HCL 50 MG PO TABS: 50.0000 mg | ORAL_TABLET | Freq: Four times a day (QID) | ORAL | 0 refills | Status: DC | PRN

## 2018-02-11 MED ORDER — KETOROLAC TROMETHAMINE 60 MG/2ML IM SOLN
60.0000 mg | Freq: Once | INTRAMUSCULAR | Status: AC
  Administered 2018-02-11: 60 mg via INTRAMUSCULAR
  Filled 2018-02-11: qty 2

## 2018-02-11 NOTE — ED Notes (Signed)
Pt ambulatory to POV without difficulty. VSS. NAD. Discharge instructions, RX and follow up discussed.

## 2018-02-11 NOTE — ED Provider Notes (Signed)
[facility] Emergency Department Provider Note  Time seen: 4:59 AM  I have reviewed the triage vital signs and the nursing notes.   HISTORY  Chief Complaint Back Pain    HPI Amber Keller is a 32 y.o. female with a past medical history of anxiety, PCOS, presents to the emergency department for left flank pain.  According to the patient since yesterday evening she has been experiencing pain in her left back.  States it was causing her difficulty sleeping so she came to the emergency department for evaluation.  Denies any nausea vomiting or diarrhea, dysuria or hematuria.  No history of kidney stones.  No fever.  Patient states the pain is worse with any movement especially twisting.  Denies any known injury to the back.  Patient does state a history of sciatica in the past and has chronic lower back pain.  Describes the back pain is dull aching, moderate in severity occasional sharp pains with movement.   Past Medical History:  Diagnosis Date  . Anxiety   . Genital herpes   . Obesity, Class III, BMI 40-49.9 (morbid obesity) (HCC)   . PCOS (polycystic ovarian syndrome)   . Prediabetes     Patient Active Problem List   Diagnosis Date Noted  . PCOS (polycystic ovarian syndrome)   . Prediabetes   . Obesity, Class III, BMI 40-49.9 (morbid obesity) (HCC)     Past Surgical History:  Procedure Laterality Date  . NO PAST SURGERIES      Prior to Admission medications   Medication Sig Start Date End Date Taking? Authorizing Provider  acyclovir (ZOVIRAX) 400 MG tablet Take 2 tablets by mouth three times daily for two days. 07/09/16   [provider]  JUNEL FE 1/20 1-20 MG-MCG tablet TK 1 T PO QD 04/02/17   [provider]  meloxicam (MOBIC) 15 MG tablet Take 1 tablet (15 mg total) daily by mouth. 08/11/17   Cuthriell, Delorise Royals, PA-C  metFORMIN (GLUCOPHAGE) 500 MG tablet Take by mouth. 03/04/17 03/04/18  [provider]   methocarbamol (ROBAXIN) 500 MG tablet Take 1 tablet (500 mg total) 4 (four) times daily by mouth. 08/11/17   Cuthriell, Delorise Royals, PA-C  naproxen (NAPROSYN) 500 MG tablet Take by mouth. 11/14/15   [provider]  phentermine (ADIPEX-P) 37.5 MG tablet TK ONE T PO QAM B BRE 03/04/17   [provider]    No Known Allergies  Family History  Problem Relation Age of Onset  . ALS Paternal Aunt   . Lung cancer Paternal Grandfather     Social History Social History   Tobacco Use  . Smoking status: Current Every Day Smoker    Packs/day: 1.00  . Smokeless tobacco: Never Used  Substance Use Topics  . Alcohol use: No  . Drug use: No    Review of Systems Constitutional: Negative for fever. Cardiovascular: Negative for chest pain. Respiratory: Negative for shortness of breath. Gastrointestinal: Negative for abdominal pain, vomiting and diarrhea. Genitourinary: Negative for urinary compaints Musculoskeletal: Left mid back pain Skin: Negative for skin complaints  Neurological: Negative for headache All other ROS negative  ____________________________________________   PHYSICAL EXAM:  VITAL SIGNS: ED Triage Vitals  Enc Vitals Group     BP 02/11/18 0437 (!) 137/92     Pulse Rate 02/11/18 0437 72     Resp 02/11/18 0437 18     Temp 02/11/18 0437 98 F (36.7 C)     Temp Source 02/11/18 0437  Oral     SpO2 02/11/18 0437 98 %     Weight 02/11/18 0435 250 lb (113.4 kg)     Height 02/11/18 0435  (1.575 m)     Head Circumference --      Peak Flow --      Pain Score 02/11/18 0434 9     Pain Loc --      Pain Edu? --      Excl. in GC? --    Constitutional: Alert and oriented. Well appearing and in no distress. Eyes: Normal exam ENT   Head: Normocephalic and atraumatic.   Mouth/Throat: Mucous membranes are moist. Cardiovascular: Normal rate, regular rhythm. No murmur Respiratory: Normal respiratory effort without tachypnea nor retractions. Breath sounds  are clear  Gastrointestinal: Soft and nontender. No distention. Musculoskeletal: Moderate left CVA tenderness to palpation, moderate lower thoracic/upper lumbar paraspinal tenderness to palpation on the left side. Neurologic:  Normal speech and language. No gross focal neurologic deficits Skin:  Skin is warm, dry and intact.  Psychiatric: Mood and affect are normal.  ____________________________________________   INITIAL IMPRESSION / ASSESSMENT AND PLAN / ED COURSE  Pertinent labs & imaging results that were available during my care of the patient were reviewed by me and considered in my medical decision making (see chart for details).  Patient presents the emergency department for left back pain.  Differential would include urinary tract infection, pyelonephritis, urolithiasis, musculoskeletal pain.  Given the differential offer the patient work-up including CT scan.  Patient states she would rather hold off on CT imaging at this time.  Patient is agreeable to urinalysis sample we will dose Toradol intramuscularly to help the patient's discomfort.  Patient's urinalysis is cloudy with 11-20 white blood cells.  Urine culture has been added on to the patient's urine.  I discussed again with the patient doing blood work and obtaining a CT scan to further evaluate, patient states she would rather hold off for now.  We will treat with an antibiotic for likely urinary tract infection we will also treat with a short course of pain medication as I do suspect her back pain is more musculoskeletal in nature.  I discussed with the patient the pain is not improved over the next 2 to 3 days or she develops a fever or worsening pain at any point she is to return to the emergency department for further evaluation.  Patient agreeable to this plan of care.  ____________________________________________   FINAL CLINICAL IMPRESSION(S) / ED DIAGNOSES  Left back pain Urinary tract infection    Minna Antis, MD 02/11/18 [phone]

## 2018-02-11 NOTE — Discharge Instructions (Addendum)
Please take your antibiotics for their full course.  Please take your pain medication as needed, as written.  You may also use ibuprofen or Tylenol as needed for discomfort as written on the box.  As we discussed please return to the emergency department if you develop worsening abdominal pain, fever, or failure of your abdominal pain to improve.  Otherwise please follow-up with your doctor in the next 2 to 3 days for recheck/reevaluation.

## 2018-02-11 NOTE — ED Triage Notes (Signed)
Patient ambulatory to triage with steady gait, without difficulty or distress noted; pt reports left flank/side pain since yesterday with no accomp symptoms; describes as sharp with movement; denies hx of same

## 2018-02-12 LAB — URINE CULTURE

## 2018-04-20 ENCOUNTER — Other Ambulatory Visit: Payer: Self-pay | Admitting: Family Medicine

## 2018-04-20 DIAGNOSIS — R1011 Right upper quadrant pain: Secondary | ICD-10-CM

## 2018-04-23 ENCOUNTER — Ambulatory Visit: Payer: Managed Care, Other (non HMO)

## 2018-04-29 ENCOUNTER — Ambulatory Visit
Admission: RE | Admit: 2018-04-29 | Discharge: 2018-04-29 | Disposition: A | Discharge status: Home / Self Care | Payer: Managed Care, Other (non HMO) | Source: Ambulatory Visit | Attending: Family Medicine | Admitting: Family Medicine

## 2018-04-29 DIAGNOSIS — R1011 Right upper quadrant pain: Secondary | ICD-10-CM | POA: Insufficient documentation

## 2018-07-26 ENCOUNTER — Other Ambulatory Visit: Payer: Self-pay

## 2018-07-26 ENCOUNTER — Encounter: Payer: Self-pay | Admitting: Gynecology

## 2018-07-26 ENCOUNTER — Ambulatory Visit
Admission: EM | Admit: 2018-07-26 | Discharge: 2018-07-26 | Disposition: A | Discharge status: Home / Self Care | Payer: Managed Care, Other (non HMO) | Attending: Emergency Medicine | Admitting: Emergency Medicine

## 2018-07-26 DIAGNOSIS — J0140 Acute pansinusitis, unspecified: Secondary | ICD-10-CM

## 2018-07-26 DIAGNOSIS — M26622 Arthralgia of left temporomandibular joint: Secondary | ICD-10-CM | POA: Diagnosis not present

## 2018-07-26 MED ORDER — CYCLOBENZAPRINE HCL 10 MG PO TABS: 10.0000 mg | ORAL_TABLET | Freq: Every day | ORAL | 0 refills | Status: DC

## 2018-07-26 MED ORDER — PREDNISONE 20 MG PO TABS: 40.0000 mg | ORAL_TABLET | Freq: Every day | ORAL | 0 refills | Status: AC

## 2018-07-26 MED ORDER — DOXYCYCLINE HYCLATE 100 MG PO CAPS: 100.0000 mg | ORAL_CAPSULE | Freq: Two times a day (BID) | ORAL | 0 refills | Status: AC

## 2018-07-26 MED ORDER — FLUTICASONE PROPIONATE 50 MCG/ACT NA SUSP: 2.0000 | Freq: Every day | NASAL | 0 refills | Status: DC

## 2018-07-26 NOTE — Discharge Instructions (Addendum)
Continue Naprosyn 500 mg twice a day, Flonase, saline nasal irrigation with a Lloyd Huger med sinus rinse and distilled water.  Try the doxycycline, and if you are not better in 7 days, then return here or see your doctor and we can consider using another antibiotic, such as a flouroquinolone.  I am hesitant to use a flouroquinolone at this time because it has serious side effects.  As for your left jaw joint/temporomandibular joint, try a soft diet for the next few days.  The Naprosyn and prednisone will help with inflammation and pain.  Take the Flexeril at night, which will prevent you from clenching her jaw/grinding your teeth.  You will need to follow-up with a dentist of your choice if you have persistent problems.

## 2018-07-26 NOTE — ED Provider Notes (Signed)
HPI  SUBJECTIVE:  Amber Keller is a 32 y.o. female who presents with a daily, hours long frontal sinus headache, with green nasal congestion and cough productive of the same material as her nasal congestion for the past month.  She reports postnasal drip, sinus pain and pressure.  No nasal congestion, rhinorrhea.  No wheezing, shortness of breath.  No fevers, facial swelling, upper dental pain.  No allergy symptoms.  She took Naprosyn this morning with improvement in her headache.  She has also been taking Alka-Seltzer cold and cough medicine, Mucinex.  She finished 7 days of amoxicillin 1 week ago for this.  States that her symptoms are worse in the morning.  She also reports daily left jaw pain described as soreness for "years".  It lasts hours and then resolves as the day goes on.  She does not grind her teeth at night, but states that she does clench her jaw "a lot".  She reports crepitus when she moves her jaw from side to side.  Her pain is not affected with chewing or yawning.  She is a smoker.  Has a history of sinusitis, prediabetes.  No history of allergies, asthma, eczema, COPD, hypertension.  LMP: At the beginning of this month.  Denies the possibility of being pregnant.  PMD: [place], [facility]     Past Medical History:  Diagnosis Date  . Anxiety   . Genital herpes   . Obesity, Class III, BMI 40-49.9 (morbid obesity) (HCC)   . PCOS (polycystic ovarian syndrome)   . Prediabetes     Past Surgical History:  Procedure Laterality Date  . KNEE SURGERY Left   . NO PAST SURGERIES      Family History  Problem Relation Age of Onset  . ALS Paternal Aunt   . Lung cancer Paternal Grandfather     Social History   Tobacco Use  . Smoking status: Current Every Day Smoker    Packs/day: 1.00  . Smokeless tobacco: Never Used  Substance Use Topics  . Alcohol use: No  . Drug use: No    No current facility-administered medications for this encounter.   Current  Outpatient Medications:  .  acyclovir (ZOVIRAX) 400 MG tablet, Take 2 tablets by mouth three times daily for two days., Disp: , Rfl:  .  gabapentin (NEURONTIN) 300 MG capsule, Take by mouth., Disp: , Rfl:  .  JUNEL FE 1/20 1-20 MG-MCG tablet, TK 1 T PO QD, Disp: , Rfl: 3 .  metFORMIN (GLUCOPHAGE) 500 MG tablet, Take by mouth., Disp: , Rfl:  .  naproxen (NAPROSYN) 500 MG tablet, Take by mouth., Disp: , Rfl:  .  phentermine (ADIPEX-P) 37.5 MG tablet, TK ONE T PO QAM B BRE, Disp: , Rfl: 0 .  traZODone (DESYREL) 50 MG tablet, 1-3 tabs po qhs prn insomnia, Disp: , Rfl:  .  cyclobenzaprine (FLEXERIL) 10 MG tablet, Take 1 tablet (10 mg total) by mouth at bedtime., Disp: 20 tablet, Rfl: 0 .  doxycycline (VIBRAMYCIN) 100 MG capsule, Take 1 capsule (100 mg total) by mouth 2 (two) times daily for 7 days., Disp: 14 capsule, Rfl: 0 .  fluticasone (FLONASE) 50 MCG/ACT nasal spray, Place 2 sprays into both nostrils daily., Disp: 16 g, Rfl: 0 .  predniSONE (DELTASONE) 20 MG tablet, Take 2 tablets (40 mg total) by mouth daily with breakfast for 5 days., Disp: 10 tablet, Rfl: 0  Allergies  Allergen Reactions  . Latex Itching     ROS  As noted in HPI.   Physical Exam  BP 140/89 (BP Location: Left Arm)   Pulse 64   Temp 98.6 F (37 C) (Oral)   Resp 16   Ht 5\' 3"  (1.6 m)   Wt 119.3 kg   LMP 07/07/2018   SpO2 99%   BMI 46.59 kg/m   Constitutional: Well developed, well nourished, no acute distress Eyes:  EOMI, conjunctiva normal bilaterally HENT: Normocephalic, atraumatic,mucus membranes moist erythematous, but not swollen turbinates.  Positive maxillary and frontal sinus tenderness.  No nasal congestion.  No cobblestoning, obvious postnasal drip. Respiratory: Normal inspiratory effort, lungs clear bilaterally.  Good air movement. Cardiovascular: Normal rate regular rhythm, no murmurs, rubs, gallops GI: nondistended skin: No rash, skin intact Musculoskeletal: no deformities Neurologic: Alert &  oriented x 3, no focal neuro deficits Psychiatric: Speech and behavior appropriate   ED Course   Medications - No data to display  No orders of the defined types were placed in this encounter.   No results found for this or any previous visit (from the past 24 hour(s)). No results found.  ED Clinical Impression  Acute non-recurrent pansinusitis  Arthralgia of left temporomandibular joint   ED Assessment/Plan  1. Presentation consistent with a sinusitis.  Given duration of symptoms, will send home with Flonase, she is to continue Naprosyn, states that she does not need a prescription for this.  Will try doxycycline, and if not better in 7 days, will then consider using Levaquin 750 or moxifloxacin 400 daily for 7 days because of the recent course of amoxicillin.  No evidence of pneumonia.  Her lungs are clear.  Feel that the cough is from the sinus drainage.    2. She also has left-sided TMJ tenderness, crepitus.  Do not think this is the cause of her headache today.  Will send home with 40 mg prednisone for 5 days which also help with the inflammation of her sinuses, Flexeril at night, advised  soft diet for the next several days.  Advised that she will need to follow-up with a dentist if her pain persists.  Follow-up with PMD or here in 5 days if sinuses are not getting better, to the ER if she gets worse.  Discussed imaging, MDM, treatment plan, and plan for follow-up with patient.  patient agrees with plan.   Meds ordered this encounter  Medications  . predniSONE (DELTASONE) 20 MG tablet    Sig: Take 2 tablets (40 mg total) by mouth daily with breakfast for 5 days.    Dispense:  10 tablet    Refill:  0  . doxycycline (VIBRAMYCIN) 100 MG capsule    Sig: Take 1 capsule (100 mg total) by mouth 2 (two) times daily for 7 days.    Dispense:  14 capsule    Refill:  0  . fluticasone (FLONASE) 50 MCG/ACT nasal spray    Sig: Place 2 sprays into both nostrils daily.    Dispense:  16  g    Refill:  0  . cyclobenzaprine (FLEXERIL) 10 MG tablet    Sig: Take 1 tablet (10 mg total) by mouth at bedtime.    Dispense:  20 tablet    Refill:  0    *This clinic note was created using Scientist, clinical (histocompatibility and immunogenetics). Therefore, there may be occasional mistakes despite careful proofreading.   ?   Domenick Gong, MD 07/26/18 1718

## 2018-07-26 NOTE — ED Triage Notes (Signed)
Patient c/o sinus infection. Per patient with head ace / nasal congestion / cough x 1 month.

## 2018-10-06 ENCOUNTER — Emergency Department
Admission: EM | Admit: 2018-10-06 | Discharge: 2018-10-06 | Disposition: A | Discharge status: Home / Self Care | Payer: Managed Care, Other (non HMO) | Attending: Emergency Medicine | Admitting: Emergency Medicine

## 2018-10-06 ENCOUNTER — Other Ambulatory Visit: Payer: Self-pay

## 2018-10-06 ENCOUNTER — Encounter: Payer: Self-pay | Admitting: Emergency Medicine

## 2018-10-06 DIAGNOSIS — Z7984 Long term (current) use of oral hypoglycemic drugs: Secondary | ICD-10-CM | POA: Insufficient documentation

## 2018-10-06 DIAGNOSIS — F17210 Nicotine dependence, cigarettes, uncomplicated: Secondary | ICD-10-CM | POA: Diagnosis not present

## 2018-10-06 DIAGNOSIS — Z91040 Latex allergy status: Secondary | ICD-10-CM | POA: Diagnosis not present

## 2018-10-06 DIAGNOSIS — Z79899 Other long term (current) drug therapy: Secondary | ICD-10-CM | POA: Diagnosis not present

## 2018-10-06 DIAGNOSIS — J1089 Influenza due to other identified influenza virus with other manifestations: Secondary | ICD-10-CM | POA: Insufficient documentation

## 2018-10-06 DIAGNOSIS — J101 Influenza due to other identified influenza virus with other respiratory manifestations: Secondary | ICD-10-CM

## 2018-10-06 DIAGNOSIS — R509 Fever, unspecified: Secondary | ICD-10-CM | POA: Diagnosis present

## 2018-10-06 LAB — INFLUENZA PANEL BY PCR (TYPE A & B)
INFLAPCR: NEGATIVE
INFLBPCR: POSITIVE — AB

## 2018-10-06 MED ORDER — OSELTAMIVIR PHOSPHATE 75 MG PO CAPS: 75.0000 mg | ORAL_CAPSULE | Freq: Two times a day (BID) | ORAL | 0 refills | Status: DC

## 2018-10-06 MED ORDER — ACETAMINOPHEN 325 MG PO TABS
ORAL_TABLET | ORAL | Status: AC
  Filled 2018-10-06: qty 2

## 2018-10-06 MED ORDER — ACETAMINOPHEN 325 MG PO TABS
650.0000 mg | ORAL_TABLET | Freq: Once | ORAL | Status: AC
  Administered 2018-10-06: 650 mg via ORAL

## 2018-10-06 MED ORDER — BENZONATATE 200 MG PO CAPS: 200.0000 mg | ORAL_CAPSULE | Freq: Three times a day (TID) | ORAL | 0 refills | Status: DC | PRN

## 2018-10-06 MED ORDER — ACETAMINOPHEN 325 MG PO TABS: 650.0000 mg | ORAL_TABLET | Freq: Once | ORAL | Status: DC | PRN

## 2018-10-06 NOTE — Discharge Instructions (Addendum)
Follow-up with regular doctor if not better in 3 to 4 days.  Take the medications as prescribed.  You are contagious until you have not had a fever for 24 hours.  Take Tylenol and ibuprofen for fever.  Drink plenty of fluids.

## 2018-10-06 NOTE — ED Triage Notes (Signed)
PT reporting cold sweats and fever since Sunday with a cough. No NVD. PT reporting generalized body aches.

## 2018-10-06 NOTE — ED Provider Notes (Signed)
[facility] Emergency Department Provider Note  ____________________________________________   First MD Initiated Contact with Patient 10/06/18 1143     (approximate)  I have reviewed the triage vital signs and the nursing notes.   HISTORY  Chief Complaint Influenza    HPI Amber Keller is a 32 y.o. femaleflulike symptoms, patient is complained of fever, chills, body aches.  Positive cough, denies sore throat, denies vomiting, denies diarrhea; denies chest pain or sob.  Sx for 2 days   Past Medical History:  Diagnosis Date  . Anxiety   . Genital herpes   . Obesity, Class III, BMI 40-49.9 (morbid obesity) (HCC)   . PCOS (polycystic ovarian syndrome)   . Prediabetes     Patient Active Problem List   Diagnosis Date Noted  . PCOS (polycystic ovarian syndrome)   . Prediabetes   . Obesity, Class III, BMI 40-49.9 (morbid obesity) (HCC)     Past Surgical History:  Procedure Laterality Date  . KNEE SURGERY Left   . NO PAST SURGERIES      Prior to Admission medications   Medication Sig Start Date End Date Taking? Authorizing Provider  acyclovir (ZOVIRAX) 400 MG tablet Take 2 tablets by mouth three times daily for two days. 07/09/16   [provider]  benzonatate (TESSALON) 200 MG capsule Take 1 capsule (200 mg total) by mouth 3 (three) times daily as needed for cough. 10/06/18   Tania Steinhauser, Roselyn BeringSusan W, PA-C  fluticasone (FLONASE) 50 MCG/ACT nasal spray Place 2 sprays into both nostrils daily. 07/26/18   Domenick GongMortenson, Ashley, MD  JUNEL FE 1/20 1-20 MG-MCG tablet TK 1 T PO QD 04/02/17   [provider]  metFORMIN (GLUCOPHAGE) 500 MG tablet Take by mouth. 03/04/17 07/26/18  [provider]  naproxen (NAPROSYN) 500 MG tablet Take by mouth. 11/14/15   [provider]  oseltamivir (TAMIFLU) 75 MG capsule Take 1 capsule (75 mg total) by mouth 2 (two) times daily. 10/06/18   Sinai Mahany, Roselyn BeringSusan W, PA-C  phentermine (ADIPEX-P) 37.5 MG  tablet TK ONE T PO QAM B BRE 03/04/17   [provider]  traZODone (DESYREL) 50 MG tablet 1-3 tabs po qhs prn insomnia 06/19/18   [provider]    Allergies Latex  Family History  Problem Relation Age of Onset  . ALS Paternal Aunt   . Lung cancer Paternal Grandfather     Social History Social History   Tobacco Use  . Smoking status: Current Every Day Smoker    Packs/day: 1.00  . Smokeless tobacco: Never Used  Substance Use Topics  . Alcohol use: No  . Drug use: No    Review of Systems  Constitutional: Positive fever/chills Eyes: No visual changes. ENT: No sore throat. Respiratory: Positive cough Genitourinary: Negative for dysuria. Musculoskeletal: Negative for back pain. Skin: Negative for rash.    ____________________________________________   PHYSICAL EXAM:  VITAL SIGNS: ED Triage Vitals  Enc Vitals Group     BP 10/06/18 1121 109/83     Pulse Rate 10/06/18 1121 (!) 104     Resp 10/06/18 1121 20     Temp 10/06/18 1121 (!) 100.9 F (38.3 C)     Temp Source 10/06/18 1121 Oral     SpO2 10/06/18 1121 97 %     Weight 10/06/18 1121 263 lb (119.3 kg)     Height 10/06/18 1121 5\' 2"  (1.575 m)     Head Circumference --      Peak Flow --  Pain Score 10/06/18 1125 10     Pain Loc --      Pain Edu? --      Excl. in [place]? --     Constitutional: Alert and oriented. Well appearing and in no acute distress. Eyes: Conjunctivae are normal.  Head: Atraumatic. Nose: No congestion/rhinnorhea. Mouth/Throat: Mucous membranes are moist.  Throat appears normal Neck:  supple no lymphadenopathy noted Cardiovascular: Normal rate, regular rhythm. Heart sounds are normal Respiratory: Normal respiratory effort.  No retractions, lungs c t a, cough is dry and hacking Abd: soft nontender bs normal all 4 quad GU: deferred Musculoskeletal: FROM all extremities, warm and well perfused Neurologic:  Normal speech and language.  Skin:  Skin is warm, dry and  intact. No rash noted. Psychiatric: Mood and affect are normal. Speech and behavior are normal.  ____________________________________________   LABS (all labs ordered are listed, but only abnormal results are displayed)  Labs Reviewed  INFLUENZA PANEL BY PCR (TYPE A & B) - Abnormal; Notable for the following components:      Result Value   Influenza B By PCR POSITIVE (*)    All other components within normal limits   ____________________________________________   ____________________________________________  RADIOLOGY    ____________________________________________   PROCEDURES  Procedure(s) performed: No  Procedures    ____________________________________________   INITIAL IMPRESSION / ASSESSMENT AND PLAN / ED COURSE  Pertinent labs & imaging results that were available during my care of the patient were reviewed by me and considered in my medical decision making (see chart for details).   Patient is 32 year old female presents emergency department with flulike symptoms.  Physical exam shows a febrile patient, dry cough.  Remainder exam is unremarkable  Influenza test is positive for influenza B.  Explained test results to the patient.  She was given prescription for Tamiflu and Tessalon Perles.  She is to follow with regular doctor if not better in 3 to 5 days.  Take Tylenol and ibuprofen for fever as needed.  She was given a work note stating that she should not return to work until she has been fever free for 24 to 48 hours.  She was discharged in stable condition.     As part of my medical decision making, I reviewed the following data within the electronic MEDICAL RECORD NUMBER Nursing notes reviewed and incorporated, Labs reviewed flu test is positive for B, Notes from prior ED visits and [place] Controlled Substance Database  ____________________________________________   FINAL CLINICAL IMPRESSION(S) / ED DIAGNOSES  Final diagnoses:  Influenza B      NEW  MEDICATIONS STARTED DURING THIS VISIT:  Discharge Medication List as of 10/06/2018 12:39 PM    START taking these medications   Details  benzonatate (TESSALON) 200 MG capsule Take 1 capsule (200 mg total) by mouth 3 (three) times daily as needed for cough., Starting Tue 10/06/2018, Normal    oseltamivir (TAMIFLU) 75 MG capsule Take 1 capsule (75 mg total) by mouth 2 (two) times daily., Starting Tue 10/06/2018, Normal         Note:  This document was prepared using Dragon voice recognition software and may include unintentional dictation errors.    Faythe GheeFisher, Kenecia Barren W, PA-C 10/06/18 1254    Dionne BucySiadecki, Sebastian, MD 10/06/18 [phone]

## 2018-10-06 NOTE — ED Notes (Signed)
Pt reports "flu" Cough, congestion, generalized body aches, fever (max 100.9)

## 2019-04-15 ENCOUNTER — Other Ambulatory Visit: Payer: Self-pay

## 2019-04-15 ENCOUNTER — Encounter: Payer: Self-pay | Admitting: Emergency Medicine

## 2019-04-15 ENCOUNTER — Ambulatory Visit
Admission: EM | Admit: 2019-04-15 | Discharge: 2019-04-15 | Disposition: A | Discharge status: Home / Self Care | Payer: BLUE CROSS/BLUE SHIELD | Attending: Nurse Practitioner | Admitting: Nurse Practitioner

## 2019-04-15 ENCOUNTER — Ambulatory Visit (INDEPENDENT_AMBULATORY_CARE_PROVIDER_SITE_OTHER): Payer: BLUE CROSS/BLUE SHIELD

## 2019-04-15 DIAGNOSIS — S39012A Strain of muscle, fascia and tendon of lower back, initial encounter: Secondary | ICD-10-CM | POA: Diagnosis not present

## 2019-04-15 DIAGNOSIS — S63502A Unspecified sprain of left wrist, initial encounter: Secondary | ICD-10-CM

## 2019-04-15 DIAGNOSIS — W19XXXA Unspecified fall, initial encounter: Secondary | ICD-10-CM

## 2019-04-15 DIAGNOSIS — M25532 Pain in left wrist: Secondary | ICD-10-CM

## 2019-04-15 MED ORDER — DICLOFENAC POTASSIUM 50 MG PO TABS: 50.0000 mg | ORAL_TABLET | Freq: Three times a day (TID) | ORAL | 0 refills | Status: DC | PRN

## 2019-04-15 MED ORDER — METHYLPREDNISOLONE SODIUM SUCC 40 MG IJ SOLR
80.0000 mg | Freq: Once | INTRAMUSCULAR | Status: AC
  Administered 2019-04-15: 80 mg via INTRAMUSCULAR

## 2019-04-15 MED ORDER — KETOROLAC TROMETHAMINE 60 MG/2ML IM SOLN
60.0000 mg | Freq: Once | INTRAMUSCULAR | Status: AC
  Administered 2019-04-15: 60 mg via INTRAMUSCULAR

## 2019-04-15 MED ORDER — CYCLOBENZAPRINE HCL 10 MG PO TABS: 10.0000 mg | ORAL_TABLET | Freq: Two times a day (BID) | ORAL | 0 refills | Status: DC | PRN

## 2019-04-15 NOTE — Discharge Instructions (Signed)
Use splint as needed  Alternate between ice and heat to affected areas  Take medications as needed  Follow-up with ortho if no improvement in symptoms   Take Care!  Aldona Bar, FNP-C

## 2019-04-15 NOTE — ED Provider Notes (Signed)
[facility]    CSN: [id number] Arrival date & time: 04/15/19  1022     History   Chief Complaint Chief Complaint  Patient presents with  . Arm Pain    HPI Cesily Zenaida Nieceicole Stall is a 33 y.o. female.   Zaryiah Zenaida Nieceicole Hebb is a 33 y.o. female complains of pain in the left wrist pain after a fall. Onset of symptoms was abrupt starting 1 day ago after falling while leaving work on 04/14/19. Patient reports that she slipped on a cardboard box which caused her to fall onto her left side. She denies hitting her head or LOC. Patient describes pain as aching. Pain severity now is moderate. The pain radiates up the entire arm. The pain is aggravated by movement, use and palpation. The patient denies any numbness, tingling, weakness, loss of sensation, loss of motion or inability to bear weight on the LUE. The patient also endorses lower back pain. She has a history of sciatica which has been aggravated from the fall. She denies any other injuries. Care prior to arrival consisted of rest and acetaminophen which has provided minimal relief.        Past Medical History:  Diagnosis Date  . Anxiety   . Genital herpes   . Obesity, Class III, BMI 40-49.9 (morbid obesity) (HCC)   . PCOS (polycystic ovarian syndrome)   . Prediabetes     Patient Active Problem List   Diagnosis Date Noted  . PCOS (polycystic ovarian syndrome)   . Prediabetes   . Obesity, Class III, BMI 40-49.9 (morbid obesity) (HCC)     Past Surgical History:  Procedure Laterality Date  . KNEE SURGERY Left   . NO PAST SURGERIES      OB History    Gravida  0   Para  0   Term  0   Preterm  0   AB  0   Living  0     SAB  0   TAB  0   Ectopic  0   Multiple  0   Live Births  0            Home Medications    Prior to Admission medications   Medication Sig Start Date End Date Taking? Authorizing Provider  acyclovir (ZOVIRAX) 400 MG tablet Take 2 tablets by mouth three times daily for  two days. 07/09/16  Yes [provider]  naproxen (NAPROSYN) 500 MG tablet Take by mouth. 11/14/15  Yes [provider]  oseltamivir (TAMIFLU) 75 MG capsule Take 1 capsule (75 mg total) by mouth 2 (two) times daily. 10/06/18  Yes Fisher, Roselyn BeringSusan W, PA-C  phentermine (ADIPEX-P) 37.5 MG tablet TK ONE T PO QAM B BRE 03/04/17  Yes [provider]  traZODone (DESYREL) 50 MG tablet 1-3 tabs po qhs prn insomnia 06/19/18  Yes [provider]  benzonatate (TESSALON) 200 MG capsule Take 1 capsule (200 mg total) by mouth 3 (three) times daily as needed for cough. 10/06/18   Fisher, Roselyn BeringSusan W, PA-C  fluticasone (FLONASE) 50 MCG/ACT nasal spray Place 2 sprays into both nostrils daily. 07/26/18   Domenick GongMortenson, Ashley, MD  JUNEL FE 1/20 1-20 MG-MCG tablet TK 1 T PO QD 04/02/17   [provider]  metFORMIN (GLUCOPHAGE) 500 MG tablet Take by mouth. 03/04/17 07/26/18  [provider]    Family History Family History  Problem Relation Age of Onset  . ALS Paternal Aunt   . Lung cancer Paternal Grandfather   .  Hypertension Mother     Social History Social History   Tobacco Use  . Smoking status: Current Every Day Smoker    Packs/day: 1.00  . Smokeless tobacco: Never Used  Substance Use Topics  . Alcohol use: No  . Drug use: No     Allergies   Latex   Review of Systems Review of Systems  Musculoskeletal: Positive for arthralgias and back pain. Negative for neck pain.  Skin: Negative for wound.  All other systems reviewed and are negative.    Physical Exam Triage Vital Signs ED Triage Vitals  Enc Vitals Group     BP 04/15/19 1032 130/85     Pulse Rate 04/15/19 1032 74     Resp 04/15/19 1032 18     Temp 04/15/19 1032 98.5 F (36.9 C)     Temp Source 04/15/19 1032 Oral     SpO2 04/15/19 1032 98 %     Weight 04/15/19 1035 260 lb (117.9 kg)     Height 04/15/19 1035 5\' 3"  (1.6 m)     Head Circumference --      Peak Flow --      Pain Score  04/15/19 1035 2     Pain Loc --      Pain Edu? --      Excl. in [place]? --    No data found.  Updated Vital Signs BP 130/85 (BP Location: Right Arm)   Pulse 74   Temp 98.5 F (36.9 C) (Oral)   Resp 18   Ht 5\' 3"  (1.6 m)   Wt 260 lb (117.9 kg)   LMP 04/06/2019   SpO2 98%   BMI 46.06 kg/m   Visual Acuity Right Eye Distance:   Left Eye Distance:   Bilateral Distance:    Right Eye Near:   Left Eye Near:    Bilateral Near:     Physical Exam Constitutional:      Appearance: Normal appearance. She is obese.  HENT:     Head: Normocephalic.  Neck:     Musculoskeletal: Normal range of motion and neck supple. Normal range of motion. No pain with movement, spinous process tenderness or muscular tenderness.  Cardiovascular:     Rate and Rhythm: Normal rate and regular rhythm.  Pulmonary:     Effort: Pulmonary effort is normal.     Breath sounds: Normal breath sounds.  Musculoskeletal: Normal range of motion.     Left wrist: She exhibits tenderness. She exhibits normal range of motion, no swelling, no effusion, no crepitus and no deformity.     Lumbar back: She exhibits tenderness.  Skin:    General: Skin is warm and dry.  Neurological:     General: No focal deficit present.     Mental Status: She is alert and oriented to person, place, and time.  Psychiatric:        Mood and Affect: Mood normal.        Behavior: Behavior normal.      UC Treatments / Results  Labs (all labs ordered are listed, but only abnormal results are displayed) Labs Reviewed - No data to display  EKG   Radiology Dg Wrist Complete Left  Result Date: 04/15/2019 CLINICAL DATA:  Pain following fall EXAM: LEFT WRIST - COMPLETE 3+ VIEW COMPARISON:  None. FINDINGS: Frontal, oblique, lateral, and ulnar deviation scaphoid images were obtained. There is no fracture or dislocation. Joint spaces appear normal. No erosive change. IMPRESSION: No fracture or dislocation.  No evident arthropathy.  Electronically  Signed   By: Bretta BangWilliam  Woodruff III M.D.   On: 04/15/2019 11:06    Procedures Procedures (including critical care time)  Medications Ordered in UC Medications  methylPREDNISolone sodium succinate (SOLU-MEDROL) 40 mg/mL injection 80 mg (has no administration in time range)  ketorolac (TORADOL) injection 60 mg (has no administration in time range)    Initial Impression / Assessment and Plan / UC Course  I have reviewed the triage vital signs and the nursing notes.  Pertinent labs & imaging results that were available during my care of the patient were reviewed by me and considered in my medical decision making (see chart for details).     33 yo female presenting with left wrist pain and exacerbated lower back pain s/p mechanical fall on 04/14/19. Patient AAOx3. Afebrile. Non-toxic appearing. PE shows mild tenderness to left wrist without swelling or deformity. X-ray negative for any fracture or dislocation. Lower back with muscular tenderness. Patient received solu-medrol 80 mg IM and Toradol 60 mg IM in clinic. Short course of NSAIDs and muscle relaxants will be prescribed. Patient advised to alternate between ice/heat to affected areas. She should follow-up with ortho in 2-3 weeks if no improvement in symptoms.   Today's evaluation has revealed no signs of a dangerous process. Discussed diagnosis with patient. Patient aware of their diagnosis, possible red flag symptoms to watch out for and need for close follow up. Patient understands verbal and written discharge instructions. Patient comfortable with plan and disposition.  Patient has a clear mental status at this time, good insight into illness (after discussion and teaching) and has clear judgment to make decisions regarding their care.  Documentation was completed with the aid of voice recognition software. Transcription may contain typographical errors. Final Clinical Impressions(s) / UC Diagnoses   Final diagnoses:  None   Discharge  Instructions   None    ED Prescriptions    None     Controlled Substance Prescriptions [place] Controlled Substance Registry consulted? Not Applicable   Lurline IdolMurrill, Raetta Agostinelli, FNP 04/15/19 1114

## 2019-04-15 NOTE — ED Triage Notes (Signed)
Patient states she had a fall yesterday and injured her left arm.

## 2019-05-11 ENCOUNTER — Ambulatory Visit
Admission: EM | Admit: 2019-05-11 | Discharge: 2019-05-11 | Disposition: A | Discharge status: Home / Self Care | Payer: BLUE CROSS/BLUE SHIELD | Attending: Family Medicine | Admitting: Family Medicine

## 2019-05-11 ENCOUNTER — Encounter: Payer: Self-pay | Admitting: Emergency Medicine

## 2019-05-11 ENCOUNTER — Other Ambulatory Visit: Payer: Self-pay

## 2019-05-11 DIAGNOSIS — M546 Pain in thoracic spine: Secondary | ICD-10-CM

## 2019-05-11 MED ORDER — TIZANIDINE HCL 4 MG PO TABS: 4.0000 mg | ORAL_TABLET | Freq: Three times a day (TID) | ORAL | 0 refills | Status: DC | PRN

## 2019-05-11 MED ORDER — MELOXICAM 15 MG PO TABS: 15.0000 mg | ORAL_TABLET | Freq: Every day | ORAL | 0 refills | Status: DC | PRN

## 2019-05-11 NOTE — ED Triage Notes (Signed)
Pt c/o middle back pain. Started a couple of weeks ago. She states it is worse when she is working.  Pt states that she started another job and that is when she started having the back pain.

## 2019-05-11 NOTE — Discharge Instructions (Signed)
Rest  Medication as prescribed.  Take care  Dr. Eliane Hammersmith  

## 2019-05-12 NOTE — ED Provider Notes (Signed)
[facility]    CSN: [id number] Arrival date & time: 05/11/19  1735   History   Chief Complaint Chief Complaint  Patient presents with  . Back Pain   HPI  33 year old female presents with left upper back pain.  Patient reports bilateral upper thoracic back pain.  Has been going on for weeks.  Worse with activities at work such as bending and stooping.  Patient states that she is taken naproxen without relief.  Continues to have moderate to severe pain.  Pain is 8/10 in severity.  No relieving factors.  Patient also has chronic back pain.  Patient states that she believes this is related to her posture as well as her weight.  No other reported symptoms.  No other complaints.  PMH, Surgical Hx, Family Hx, Social History reviewed and updated as below.  Past Medical History:  Diagnosis Date  . Anxiety   . Genital herpes   . Obesity, Class III, BMI 40-49.9 (morbid obesity) (HCC)   . PCOS (polycystic ovarian syndrome)   . Prediabetes     Patient Active Problem List   Diagnosis Date Noted  . PCOS (polycystic ovarian syndrome)   . Prediabetes   . Obesity, Class III, BMI 40-49.9 (morbid obesity) (HCC)     Past Surgical History:  Procedure Laterality Date  . KNEE SURGERY Left   . NO PAST SURGERIES      OB History    Gravida  0   Para  0   Term  0   Preterm  0   AB  0   Living  0     SAB  0   TAB  0   Ectopic  0   Multiple  0   Live Births  0            Home Medications    Prior to Admission medications   Medication Sig Start Date End Date Taking? Authorizing Provider  JUNEL FE 1/20 1-20 MG-MCG tablet TK 1 T PO QD 04/02/17  Yes [provider]  phentermine (ADIPEX-P) 37.5 MG tablet TK ONE T PO QAM B BRE 03/04/17  Yes [provider]  meloxicam (MOBIC) 15 MG tablet Take 1 tablet (15 mg total) by mouth daily as needed. 05/11/19   Tommie Samsook, Thamas Appleyard G, DO  metFORMIN (GLUCOPHAGE) 500 MG tablet Take by mouth. 03/04/17 07/26/18  [provider]  tiZANidine (ZANAFLEX) 4 MG tablet Take 1 tablet (4 mg total) by mouth every 8 (eight) hours as needed for muscle spasms. 05/11/19   Tommie Samsook, Kaidan Harpster G, DO  fluticasone (FLONASE) 50 MCG/ACT nasal spray Place 2 sprays into both nostrils daily. 07/26/18 04/15/19  Domenick GongMortenson, Ashley, MD  traZODone (DESYREL) 50 MG tablet 1-3 tabs po qhs prn insomnia 06/19/18 05/11/19  [provider]    Family History Family History  Problem Relation Age of Onset  . ALS Paternal Aunt   . Lung cancer Paternal Grandfather   . Hypertension Mother     Social History Social History   Tobacco Use  . Smoking status: Current Every Day Smoker    Packs/day: 1.00  . Smokeless tobacco: Never Used  Substance Use Topics  . Alcohol use: No  . Drug use: No     Allergies   Latex   Review of Systems Review of Systems  Constitutional: Negative.   Musculoskeletal: Positive for back pain.   Physical Exam Triage Vital Signs ED Triage Vitals  Enc Vitals Group     BP 05/11/19  1757 130/89     Pulse Rate 05/11/19 1757 86     Resp 05/11/19 1757 18     Temp 05/11/19 1757 98.2 F (36.8 C)     Temp Source 05/11/19 1757 Oral     SpO2 05/11/19 1757 97 %     Weight 05/11/19 1754 260 lb (117.9 kg)     Height 05/11/19 1754 5\' 2"  (1.575 m)     Head Circumference --      Peak Flow --      Pain Score 05/11/19 1753 8     Pain Loc --      Pain Edu? --      Excl. in [place]? --    No data found.  Updated Vital Signs BP 130/89 (BP Location: Left Arm)   Pulse 86   Temp 98.2 F (36.8 C) (Oral)   Resp 18   Ht 5\' 2"  (1.575 m)   Wt 117.9 kg   LMP 04/29/2019 (Approximate)   SpO2 97%   BMI 47.55 kg/m   Visual Acuity Right Eye Distance:   Left Eye Distance:   Bilateral Distance:    Right Eye Near:   Left Eye Near:    Bilateral Near:     Physical Exam Vitals signs and nursing note reviewed.  Constitutional:      General: She is not in acute distress.    Appearance: Normal appearance. She is obese.   HENT:     Head: Normocephalic and atraumatic.  Eyes:     General:        Right eye: No discharge.        Left eye: No discharge.     Conjunctiva/sclera: Conjunctivae normal.  Cardiovascular:     Rate and Rhythm: Normal rate and regular rhythm.  Pulmonary:     Effort: Pulmonary effort is normal.     Breath sounds: Normal breath sounds. No wheezing, rhonchi or rales.  Musculoskeletal:       Arms:     Comments: Patient endorsing pain at the leg location.  No apparent spasm.  No discrete tenderness to palpation.  Neurological:     Mental Status: She is alert.  Psychiatric:        Behavior: Behavior normal.     Comments: Flat affect.    UC Treatments / Results  Labs (all labs ordered are listed, but only abnormal results are displayed) Labs Reviewed - No data to display  EKG   Radiology No results found.  Procedures Procedures (including critical care time)  Medications Ordered in UC Medications - No data to display  Initial Impression / Assessment and Plan / UC Course  I have reviewed the triage vital signs and the nursing notes.  Pertinent labs & imaging results that were available during my care of the patient were reviewed by me and considered in my medical decision making (see chart for details).    33 year old female presents with thoracic back pain.  Treating with meloxicam and Zanaflex.  Patient needs weight loss.  Patient needs to work on posture as well.  Final Clinical Impressions(s) / UC Diagnoses   Final diagnoses:  Acute bilateral thoracic back pain     Discharge Instructions     Rest.  Medication as prescribed.  Take care  Dr. Lacinda Axon    ED Prescriptions    Medication Sig Dispense Auth. Provider   meloxicam (MOBIC) 15 MG tablet Take 1 tablet (15 mg total) by mouth daily as needed. 30 tablet [place], [place],  DO   tiZANidine (ZANAFLEX) 4 MG tablet Take 1 tablet (4 mg total) by mouth every 8 (eight) hours as needed for muscle spasms. 30 tablet  Tommie Samsook, Stefan Karen G, DO     Controlled Substance Prescriptions [place] Controlled Substance Registry consulted? Not Applicable   Tommie SamsCook, Kathi Dohn G, [place] 05/12/19 [id number]

## 2019-05-24 ENCOUNTER — Ambulatory Visit (INDEPENDENT_AMBULATORY_CARE_PROVIDER_SITE_OTHER)
Admission: EM | Admit: 2019-05-24 | Discharge: 2019-05-24 | Disposition: A | Discharge status: Home / Self Care | Payer: BLUE CROSS/BLUE SHIELD | Source: Home / Self Care | Attending: Family Medicine | Admitting: Family Medicine

## 2019-05-24 ENCOUNTER — Encounter: Payer: Self-pay | Admitting: Emergency Medicine

## 2019-05-24 ENCOUNTER — Other Ambulatory Visit: Payer: Self-pay

## 2019-05-24 DIAGNOSIS — R509 Fever, unspecified: Secondary | ICD-10-CM | POA: Diagnosis present

## 2019-05-24 DIAGNOSIS — R0602 Shortness of breath: Secondary | ICD-10-CM | POA: Insufficient documentation

## 2019-05-24 DIAGNOSIS — Z79899 Other long term (current) drug therapy: Secondary | ICD-10-CM | POA: Insufficient documentation

## 2019-05-24 DIAGNOSIS — R112 Nausea with vomiting, unspecified: Secondary | ICD-10-CM

## 2019-05-24 DIAGNOSIS — R7303 Prediabetes: Secondary | ICD-10-CM | POA: Insufficient documentation

## 2019-05-24 DIAGNOSIS — R1110 Vomiting, unspecified: Secondary | ICD-10-CM | POA: Diagnosis not present

## 2019-05-24 DIAGNOSIS — M7918 Myalgia, other site: Secondary | ICD-10-CM | POA: Diagnosis not present

## 2019-05-24 DIAGNOSIS — Z20828 Contact with and (suspected) exposure to other viral communicable diseases: Secondary | ICD-10-CM | POA: Insufficient documentation

## 2019-05-24 DIAGNOSIS — B349 Viral infection, unspecified: Secondary | ICD-10-CM | POA: Insufficient documentation

## 2019-05-24 DIAGNOSIS — Z7984 Long term (current) use of oral hypoglycemic drugs: Secondary | ICD-10-CM | POA: Diagnosis not present

## 2019-05-24 DIAGNOSIS — R197 Diarrhea, unspecified: Secondary | ICD-10-CM | POA: Insufficient documentation

## 2019-05-24 DIAGNOSIS — F17210 Nicotine dependence, cigarettes, uncomplicated: Secondary | ICD-10-CM | POA: Insufficient documentation

## 2019-05-24 LAB — COMPREHENSIVE METABOLIC PANEL
ALT: 74 U/L — ABNORMAL HIGH (ref 0–44)
AST: 70 U/L — ABNORMAL HIGH (ref 15–41)
Albumin: 3.7 g/dL (ref 3.5–5.0)
Alkaline Phosphatase: 67 U/L (ref 38–126)
Anion gap: 9 (ref 5–15)
BUN: 13 mg/dL (ref 6–20)
CO2: 23 mmol/L (ref 22–32)
Calcium: 8.4 mg/dL — ABNORMAL LOW (ref 8.9–10.3)
Chloride: 102 mmol/L (ref 98–111)
Creatinine, Ser: 0.78 mg/dL (ref 0.44–1.00)
GFR calc Af Amer: >60 mL/min (ref >60)
GFR calc non Af Amer: >60 mL/min (ref >60)
Glucose, Bld: 116 mg/dL — ABNORMAL HIGH (ref 70–99)
Potassium: 3.6 mmol/L (ref 3.5–5.1)
Sodium: 134 mmol/L — ABNORMAL LOW (ref 135–145)
Total Bilirubin: 0.8 mg/dL (ref 0.3–1.2)
Total Protein: 7.1 g/dL (ref 6.5–8.1)

## 2019-05-24 LAB — URINALYSIS, COMPLETE (UACMP) WITH MICROSCOPIC
Bacteria, UA: NONE SEEN
Bilirubin Urine: NEGATIVE
Glucose, UA: NEGATIVE mg/dL
Glucose, UA: NEGATIVE mg/dL
Ketones, ur: NEGATIVE mg/dL
Leukocytes,Ua: NEGATIVE
Leukocytes,Ua: NEGATIVE
Nitrite: NEGATIVE
Nitrite: NEGATIVE
Protein, ur: NEGATIVE mg/dL
Protein, ur: NEGATIVE mg/dL
Specific Gravity, Urine: >1.03 — ABNORMAL HIGH (ref 1.005–1.030)
Specific Gravity, Urine: 1.03 (ref 1.005–1.030)
pH: 5 (ref 5.0–8.0)
pH: 6 (ref 5.0–8.0)

## 2019-05-24 LAB — CBC WITH DIFFERENTIAL/PLATELET
Abs Immature Granulocytes: 0.01 10*3/uL (ref 0.00–0.07)
Basophils Absolute: 0 10*3/uL (ref 0.0–0.1)
Basophils Relative: 0 %
Eosinophils Absolute: 0 10*3/uL (ref 0.0–0.5)
Eosinophils Relative: 0 %
HCT: 43 % (ref 36.0–46.0)
Hemoglobin: 14.2 g/dL (ref 12.0–15.0)
Immature Granulocytes: 0 %
Lymphocytes Relative: 8 %
Lymphs Abs: 0.6 10*3/uL — ABNORMAL LOW (ref 0.7–4.0)
MCH: 29.4 pg (ref 26.0–34.0)
MCHC: 33 g/dL (ref 30.0–36.0)
MCV: 89 fL (ref 80.0–100.0)
Monocytes Absolute: 0.3 10*3/uL (ref 0.1–1.0)
Monocytes Relative: 4 %
Neutro Abs: 5.7 10*3/uL (ref 1.7–7.7)
Neutrophils Relative %: 88 %
Platelets: 170 10*3/uL (ref 150–400)
RBC: 4.83 MIL/uL (ref 3.87–5.11)
RDW: 12.4 % (ref 11.5–15.5)
WBC: 6.5 10*3/uL (ref 4.0–10.5)
nRBC: 0 % (ref 0.0–0.2)

## 2019-05-24 MED ORDER — ONDANSETRON 4 MG PO TBDP: 4.0000 mg | ORAL_TABLET | Freq: Three times a day (TID) | ORAL | 0 refills | Status: DC | PRN

## 2019-05-24 NOTE — Discharge Instructions (Signed)
Rest. Fluids.  Zofran as needed.   Take care  Dr. Lacinda Axon

## 2019-05-24 NOTE — ED Triage Notes (Signed)
Pt reports she started today with nausea and vomited one time. Pt also feels short of breath. Denies pain but stomach is "bubbly".

## 2019-05-24 NOTE — ED Triage Notes (Addendum)
Patient to ER for c/o fever with shortness of breath (worse on exertion). Patient does report nasal congestion, as well as a "bubbly" feeling to abdomen with emesis x1, 4 BM's today that "had a little diarrhea in it". Patient denies any pain anywhere. +Frequent urination. Patient ambulatory to triage without difficulty.

## 2019-05-24 NOTE — ED Provider Notes (Signed)
[facility]    CSN: [id number] Arrival date & time: 05/24/19  1317  History   Chief Complaint Chief Complaint  Patient presents with  . Shortness of Breath  . Nausea   HPI  33 year old female presents with the above complaints.  Patient reports that her symptoms started today.  She had nausea and vomiting 1 time today.  Patient states that her stomach feels "bubbly".  No documented fever.  No known inciting factor.  Patient reports that she feels slightly short of breath as well.  She states that she feels like she cannot get a good deep breath.  Denies pain at this time.  No medications or interventions tried.  Patient is in need of a note for work.    Additionally, patient reports that she has had urinary urgency for the past few weeks.  Patient states that she feels incredible urgency to use the restroom.  She has not had any incontinence.  No dysuria.  PMH, Surgical Hx, Family Hx, Social History reviewed and updated as below.  Past Medical History:  Diagnosis Date  . Anxiety   . Genital herpes   . Obesity, Class III, BMI 40-49.9 (morbid obesity) ([place])   . PCOS (polycystic ovarian syndrome)   . Prediabetes     Patient Active Problem List   Diagnosis Date Noted  . PCOS (polycystic ovarian syndrome)   . Prediabetes   . Obesity, Class III, BMI 40-49.9 (morbid obesity) ([place])     Past Surgical History:  Procedure Laterality Date  . KNEE SURGERY Left   . NO PAST SURGERIES      OB History    Gravida  0   Para  0   Term  0   Preterm  0   AB  0   Living  0     SAB  0   TAB  0   Ectopic  0   Multiple  0   Live Births  0            Home Medications    Prior to Admission medications   Medication Sig Start Date End Date Taking? Authorizing Provider  acyclovir (ZOVIRAX) 400 MG tablet TAKE 2 TABLETS BY MOUTH THREE TIMES DAILY FOR 2 DAYS 03/04/19  Yes [provider]  gabapentin (NEURONTIN) 300 MG capsule Take by mouth. 03/04/19  11/09/19 Yes [provider]  naproxen (NAPROSYN) 500 MG tablet Take by mouth. 03/04/19  Yes [provider]  JUNEL FE 1/20 1-20 MG-MCG tablet TK 1 T PO QD 04/02/17   [provider]  meloxicam (MOBIC) 15 MG tablet Take 1 tablet (15 mg total) by mouth daily as needed. 05/11/19   Coral Spikes, DO  metFORMIN (GLUCOPHAGE) 500 MG tablet Take by mouth. 03/04/17 07/26/18  [provider]  ondansetron (ZOFRAN-ODT) 4 MG disintegrating tablet Take 1 tablet (4 mg total) by mouth every 8 (eight) hours as needed for nausea or vomiting. 05/24/19   Coral Spikes, DO  phentermine (ADIPEX-P) 37.5 MG tablet TK ONE T PO QAM B BRE 03/04/17   [provider]  tiZANidine (ZANAFLEX) 4 MG tablet Take 1 tablet (4 mg total) by mouth every 8 (eight) hours as needed for muscle spasms. 05/11/19   Coral Spikes, DO  fluticasone (FLONASE) 50 MCG/ACT nasal spray Place 2 sprays into both nostrils daily. 07/26/18 04/15/19  Melynda Ripple, MD  traZODone (DESYREL) 50 MG tablet 1-3 tabs po qhs prn insomnia 06/19/18 05/11/19  [provider]  Family History Family History  Problem Relation Age of Onset  . ALS Paternal Aunt   . Lung cancer Paternal Grandfather   . Hypertension Mother     Social History Social History   Tobacco Use  . Smoking status: Current Every Day Smoker    Packs/day: 1.00  . Smokeless tobacco: Never Used  Substance Use Topics  . Alcohol use: Yes    Comment: occasional  . Drug use: No     Allergies   Latex   Review of Systems Review of Systems  Constitutional: Negative for fever.  Respiratory: Positive for shortness of breath.   Gastrointestinal: Positive for nausea and vomiting.   Physical Exam Triage Vital Signs ED Triage Vitals  Enc Vitals Group     BP 05/24/19 1344 (!) 126/92     Pulse Rate 05/24/19 1344 97     Resp 05/24/19 1344 (!) 22     Temp 05/24/19 1344 98.4 F (36.9 C)     Temp Source 05/24/19 1344 Oral     SpO2 05/24/19 1344  97 %     Weight 05/24/19 1343 260 lb (117.9 kg)     Height 05/24/19 1343 5\' 2"  (1.575 m)     Head Circumference --      Peak Flow --      Pain Score 05/24/19 1343 0     Pain Loc --      Pain Edu? --      Excl. in [place]? --    Updated Vital Signs BP (!) 126/92 (BP Location: Left Arm)   Pulse 97   Temp 98.4 F (36.9 C) (Oral)   Resp (!) 22   Ht 5\' 2"  (1.575 m)   Wt 117.9 kg   LMP 04/29/2019 (Approximate)   SpO2 97%   BMI 47.55 kg/m   Visual Acuity Right Eye Distance:   Left Eye Distance:   Bilateral Distance:    Right Eye Near:   Left Eye Near:    Bilateral Near:     Physical Exam Constitutional:      Appearance: She is well-developed.     Comments: Morbidly obese.  HENT:     Head: Normocephalic and atraumatic.  Eyes:     General:        Right eye: No discharge.        Left eye: No discharge.     Conjunctiva/sclera: Conjunctivae normal.  Cardiovascular:     Rate and Rhythm: Normal rate and regular rhythm.  Pulmonary:     Effort: Pulmonary effort is normal.     Breath sounds: Normal breath sounds. No wheezing, rhonchi or rales.  Abdominal:     General: There is no distension.     Palpations: Abdomen is soft.     Tenderness: There is no abdominal tenderness.  Neurological:     Mental Status: She is alert.  Psychiatric:        Behavior: Behavior normal.     Comments: Flat affect.    UC Treatments / Results  Labs (all labs ordered are listed, but only abnormal results are displayed) Labs Reviewed  URINALYSIS, COMPLETE (UACMP) WITH MICROSCOPIC - Abnormal; Notable for the following components:      Result Value   Color, Urine AMBER (*)    APPearance HAZY (*)    Specific Gravity, Urine >1.030 (*)    Hgb urine dipstick TRACE (*)    Bilirubin Urine SMALL (*)    Ketones, ur TRACE (*)    Bacteria, UA FEW (*)  All other components within normal limits  NOVEL CORONAVIRUS, NAA (HOSPITAL ORDER, SEND-OUT TO REF LAB)    EKG   Radiology No results found.   Procedures Procedures (including critical care time)  Medications Ordered in UC Medications - No data to display  Initial Impression / Assessment and Plan / UC Course  I have reviewed the triage vital signs and the nursing notes.  Pertinent labs & imaging results that were available during my care of the patient were reviewed by me and considered in my medical decision making (see chart for details).    33 year old female presents with nausea, vomiting, urinary urgency, and shortness of breath.  Patient is well-appearing clinically.  Urinalysis unremarkable.  Treating supportively with Zofran.  COVID testing done today and was negative.  Final Clinical Impressions(s) / UC Diagnoses   Final diagnoses:  Non-intractable vomiting with nausea, unspecified vomiting type     Discharge Instructions     Rest. Fluids.  Zofran as needed.   Take care  Dr. Adriana Simasook    ED Prescriptions    Medication Sig Dispense Auth. Provider   ondansetron (ZOFRAN-ODT) 4 MG disintegrating tablet Take 1 tablet (4 mg total) by mouth every 8 (eight) hours as needed for nausea or vomiting. 20 tablet Tommie Samsook, Ailee Pates G, DO     Controlled Substance Prescriptions [place] Controlled Substance Registry consulted? Not Applicable   Tommie SamsCook, Takeem Krotzer G, DO 05/26/19 [id number]

## 2019-05-25 ENCOUNTER — Emergency Department: Payer: BLUE CROSS/BLUE SHIELD

## 2019-05-25 ENCOUNTER — Emergency Department
Admission: EM | Admit: 2019-05-25 | Discharge: 2019-05-25 | Disposition: A | Discharge status: Home / Self Care | Payer: BLUE CROSS/BLUE SHIELD | Attending: Emergency Medicine | Admitting: Emergency Medicine

## 2019-05-25 DIAGNOSIS — R059 Cough, unspecified: Secondary | ICD-10-CM

## 2019-05-25 DIAGNOSIS — R05 Cough: Secondary | ICD-10-CM

## 2019-05-25 DIAGNOSIS — R509 Fever, unspecified: Secondary | ICD-10-CM

## 2019-05-25 DIAGNOSIS — B349 Viral infection, unspecified: Secondary | ICD-10-CM

## 2019-05-25 DIAGNOSIS — Z20822 Contact with and (suspected) exposure to covid-19: Secondary | ICD-10-CM

## 2019-05-25 LAB — SARS CORONAVIRUS 2 BY RT PCR (HOSPITAL ORDER, PERFORMED IN ~~LOC~~ HOSPITAL LAB): SARS Coronavirus 2: NEGATIVE

## 2019-05-25 LAB — NOVEL CORONAVIRUS, NAA (HOSP ORDER, SEND-OUT TO REF LAB; TAT 18-24 HRS): SARS-CoV-2, NAA: NOT DETECTED

## 2019-05-25 LAB — PREGNANCY, URINE: Preg Test, Ur: NEGATIVE

## 2019-05-25 LAB — TROPONIN I (HIGH SENSITIVITY): Troponin I (High Sensitivity): 4 ng/L (ref <18)

## 2019-05-25 MED ORDER — ACETAMINOPHEN 500 MG PO TABS
1000.0000 mg | ORAL_TABLET | Freq: Once | ORAL | Status: AC
  Administered 2019-05-25: 1000 mg via ORAL
  Filled 2019-05-25: qty 2

## 2019-05-25 MED ORDER — ONDANSETRON HCL 4 MG/2ML IJ SOLN: 4.0000 mg | Freq: Once | INTRAMUSCULAR | Status: DC

## 2019-05-25 MED ORDER — KETOROLAC TROMETHAMINE 30 MG/ML IJ SOLN: 15.0000 mg | Freq: Once | INTRAMUSCULAR | Status: DC

## 2019-05-25 MED ORDER — ONDANSETRON 4 MG PO TBDP
4.0000 mg | ORAL_TABLET | Freq: Once | ORAL | Status: AC
  Administered 2019-05-25: 4 mg via ORAL
  Filled 2019-05-25: qty 1

## 2019-05-25 NOTE — Discharge Instructions (Signed)

## 2019-05-25 NOTE — ED Notes (Signed)
MD at the bedside for pt evaluation  

## 2019-05-25 NOTE — ED Provider Notes (Signed)
[facility] Emergency Department Provider Note  ____________________________________________  Time seen: Approximately 12:33 AM  I have reviewed the triage vital signs and the nursing notes.   HISTORY  Chief Complaint Fever   HPI Amber Keller is a 33 y.o. female with a history of obesity, anxiety, PCOS who presents for evaluation of fever.  Symptoms started today.  Reports a fever of 101 at home.  Has not taken any Tylenol or ibuprofen.  She is also complaining of body aches, one episode of nonbloody nonbilious emesis, one episode of watery diarrhea.  She is complaining of mild shortness of breath with exertion.  No cough, no congestion, no chest pain, no abdominal pain, no loss of smell or taste.  No personal or family history of blood clots, no recent travel immobilization, no leg pain or swelling, no hemoptysis.  She does take OCPs.  No known exposures to COVID.   Past Medical History:  Diagnosis Date   Anxiety    Genital herpes    Obesity, Class III, BMI 40-49.9 (morbid obesity) (HCC)    PCOS (polycystic ovarian syndrome)    Prediabetes     Patient Active Problem List   Diagnosis Date Noted   PCOS (polycystic ovarian syndrome)    Prediabetes    Obesity, Class III, BMI 40-49.9 (morbid obesity) (HCC)     Past Surgical History:  Procedure Laterality Date   KNEE SURGERY Left    NO PAST SURGERIES      Prior to Admission medications   Medication Sig Start Date End Date Taking? Authorizing Provider  acyclovir (ZOVIRAX) 400 MG tablet TAKE 2 TABLETS BY MOUTH THREE TIMES DAILY FOR 2 DAYS 03/04/19   [provider]  gabapentin (NEURONTIN) 300 MG capsule Take by mouth. 03/04/19 11/09/19  [provider]  JUNEL FE 1/20 1-20 MG-MCG tablet TK 1 T PO QD 04/02/17   [provider]  meloxicam (MOBIC) 15 MG tablet Take 1 tablet (15 mg total) by mouth daily as needed. 05/11/19   Tommie Samsook, Jayce G, DO  metFORMIN (GLUCOPHAGE)  500 MG tablet Take by mouth. 03/04/17 07/26/18  [provider]  naproxen (NAPROSYN) 500 MG tablet Take by mouth. 03/04/19   [provider]  ondansetron (ZOFRAN-ODT) 4 MG disintegrating tablet Take 1 tablet (4 mg total) by mouth every 8 (eight) hours as needed for nausea or vomiting. 05/24/19   Tommie Samsook, Jayce G, DO  phentermine (ADIPEX-P) 37.5 MG tablet TK ONE T PO QAM B BRE 03/04/17   [provider]  tiZANidine (ZANAFLEX) 4 MG tablet Take 1 tablet (4 mg total) by mouth every 8 (eight) hours as needed for muscle spasms. 05/11/19   Tommie Samsook, Jayce G, DO  fluticasone (FLONASE) 50 MCG/ACT nasal spray Place 2 sprays into both nostrils daily. 07/26/18 04/15/19  Domenick GongMortenson, Ashley, MD  traZODone (DESYREL) 50 MG tablet 1-3 tabs po qhs prn insomnia 06/19/18 05/11/19  [provider]    Allergies Latex  Family History  Problem Relation Age of Onset   ALS Paternal Aunt    Lung cancer Paternal Grandfather    Hypertension Mother     Social History Social History   Tobacco Use   Smoking status: Current Every Day Smoker    Packs/day: 1.00   Smokeless tobacco: Never Used  Substance Use Topics   Alcohol use: Yes    Comment: occasional   Drug use: No    Review of Systems  Constitutional: + fever, body aches Eyes: Negative for visual changes.  ENT: Negative for sore throat. Neck: No neck pain  Cardiovascular: Negative for chest pain. Respiratory: + shortness of breath. Gastrointestinal: Negative for abdominal pain. + vomiting and diarrhea. Genitourinary: Negative for dysuria. Musculoskeletal: Negative for back pain. Skin: Negative for rash. Neurological: Negative for headaches, weakness or numbness. Psych: No SI or HI  ____________________________________________   PHYSICAL EXAM:  VITAL SIGNS: ED Triage Vitals  Enc Vitals Group     BP 05/24/19 2125 128/84     Pulse Rate 05/24/19 2125 (!) 124     Resp 05/24/19 2125 20     Temp 05/24/19 2125 100.2 F  (37.9 C)     Temp Source 05/24/19 2125 Oral     SpO2 05/24/19 2125 96 %     Weight 05/24/19 2127 259 lb 14.8 oz (117.9 kg)     Height 05/24/19 2127 5\' 2"  (1.575 m)     Head Circumference --      Peak Flow --      Pain Score 05/24/19 2126 0     Pain Loc --      Pain Edu? --      Excl. in [place]? --     Constitutional: Alert and oriented. Well appearing and in no apparent distress. HEENT:      Head: Normocephalic and atraumatic.         Eyes: Conjunctivae are normal. Sclera is non-icteric.       Mouth/Throat: Mucous membranes are moist.       Neck: Supple with no signs of meningismus. Cardiovascular: Tachycardic with regular rhythm. No murmurs, gallops, or rubs. 2+ symmetrical distal pulses are present in all extremities. No JVD. Respiratory: Normal respiratory effort. Lungs are clear to auscultation bilaterally. No wheezes, crackles, or rhonchi.  Gastrointestinal: Soft, non tender, and non distended with positive bowel sounds. No rebound or guarding. Musculoskeletal: Nontender with normal range of motion in all extremities. No edema, cyanosis, or erythema of extremities. Neurologic: Normal speech and language. Face is symmetric. Moving all extremities. No gross focal neurologic deficits are appreciated. Skin: Skin is warm, dry and intact. No rash noted. Psychiatric: Mood and affect are normal. Speech and behavior are normal.  ____________________________________________   LABS (all labs ordered are listed, but only abnormal results are displayed)  Labs Reviewed  COMPREHENSIVE METABOLIC PANEL - Abnormal; Notable for the following components:      Result Value   Sodium 134 (*)    Glucose, Bld 116 (*)    Calcium 8.4 (*)    AST 70 (*)    ALT 74 (*)    All other components within normal limits  CBC WITH DIFFERENTIAL/PLATELET - Abnormal; Notable for the following components:   Lymphs Abs 0.6 (*)    All other components within normal limits  URINALYSIS, COMPLETE (UACMP) WITH  MICROSCOPIC - Abnormal; Notable for the following components:   Color, Urine AMBER (*)    APPearance CLOUDY (*)    Hgb urine dipstick SMALL (*)    All other components within normal limits  SARS CORONAVIRUS 2 (HOSPITAL ORDER, PERFORMED IN [facility])  PREGNANCY, URINE  POC URINE PREG, ED  TROPONIN I (HIGH SENSITIVITY)   ____________________________________________  EKG  ED ECG REPORT I, Nita Sicklearolina Gradie Butrick, the attending physician, personally viewed and interpreted this ECG.  Sinus tachycardia, rate of 110, normal intervals, normal axis, no ST elevations or depressions.  No prior for comparison. ____________________________________________  RADIOLOGY  I have personally reviewed the images performed during this visit and I agree with  the Radiologist's read.   Interpretation by Radiologist:  Dg Chest Port 1 View  Result Date: 05/25/2019 CLINICAL DATA:  Cough and fever. Shortness of breath. EXAM: PORTABLE CHEST 1 VIEW COMPARISON:  Radiograph 10/05/2015 FINDINGS: Low lung volumes despite repeat acquisition. Heart is normal in size. Normal mediastinal contours. No focal airspace disease, pleural effusion, or pneumothorax. Chronic deformity of the distal right clavicle. No acute osseous abnormalities. IMPRESSION: Low lung volumes without acute abnormality. Electronically Signed   By: Keith Rake M.D.   On: 05/25/2019 00:28     ____________________________________________   PROCEDURES  Procedure(s) performed: None Procedures Critical Care performed:  None ____________________________________________   INITIAL IMPRESSION / ASSESSMENT AND PLAN / ED COURSE  33 y.o. female with a history of obesity, anxiety, PCOS who presents for evaluation of fever, body aches, N/V/D, and SOB.  Patient has a low-grade temp of 100.2 and tachycardic with a pulse of 124.  EKG shows sinus tachycardia with no ischemia or dysrhythmias.  Chest x-ray showed no evidence of pneumonia.  COVID  swab is pending.  Labs with no evidence of sepsis.  LFTs are slightly elevated which is chronic for patient.  UA negative for UTI.  Pregnancy test negative. Troponin negative with no evidence of myocarditis. Ddx viral illness vs COVID vs PNA. Will give tylenol and zofran and reassess       As part of my medical decision making, I reviewed the following data within the [facility] notes reviewed and incorporated, Labs reviewed , EKG interpreted , Old chart reviewed, Radiograph reviewed , Notes from prior ED visits and [place] Controlled Substance Database   Patient was evaluated in Emergency Department today for the symptoms described in the history of present illness. Patient was evaluated in the context of the global COVID-19 pandemic, which necessitated consideration that the patient might be at risk for infection with the SARS-CoV-2 virus that causes COVID-19. Institutional protocols and algorithms that pertain to the evaluation of patients at risk for COVID-19 are in a state of rapid change based on information released by regulatory bodies including the CDC and federal and state organizations. These policies and algorithms were followed during the patient's care in the ED.   ____________________________________________   FINAL CLINICAL IMPRESSION(S) / ED DIAGNOSES   Final diagnoses:  Fever, unspecified fever cause  Viral syndrome  Suspected Covid-19 Virus Infection      NEW MEDICATIONS STARTED DURING THIS VISIT:  ED Discharge Orders    None       Note:  This document was prepared using Dragon voice recognition software and may include unintentional dictation errors.    Alfred Levins, [place], MD 05/25/19 [phone]

## 2019-05-25 NOTE — ED Notes (Signed)
MD at the bedside to discuss results and plan of care.  

## 2019-05-26 ENCOUNTER — Encounter (HOSPITAL_COMMUNITY): Payer: Self-pay

## 2019-10-02 ENCOUNTER — Encounter: Payer: Self-pay | Admitting: Emergency Medicine

## 2019-10-02 ENCOUNTER — Other Ambulatory Visit: Payer: Self-pay

## 2019-10-02 ENCOUNTER — Ambulatory Visit
Admission: EM | Admit: 2019-10-02 | Discharge: 2019-10-02 | Disposition: A | Discharge status: Home / Self Care | Payer: BLUE CROSS/BLUE SHIELD | Attending: Internal Medicine | Admitting: Internal Medicine

## 2019-10-02 DIAGNOSIS — Z20822 Contact with and (suspected) exposure to covid-19: Secondary | ICD-10-CM

## 2019-10-02 DIAGNOSIS — Z20828 Contact with and (suspected) exposure to other viral communicable diseases: Secondary | ICD-10-CM | POA: Diagnosis not present

## 2019-10-02 NOTE — ED Triage Notes (Signed)
Patient c/o nasal congestion and sinus pressure that started this morning.  Patient was around someone who tested positive for COVID this week.  Patient denies fevers.

## 2019-10-02 NOTE — ED Provider Notes (Signed)
[facility]    CSN: [id number] Arrival date & time: 10/02/19  1418      History   Chief Complaint Chief Complaint  Patient presents with  . COVID Test  . Sinus Problem    HPI Amber Keller is a 33 y.o. female with history of sinusitis comes to urgent care for COVID-19 testing.  Patient had exposure to COVID-19 positive individual on Christmas eve.  Patient has sinusitis symptoms predating the exposure.  She denies any cough sputum production loss of taste or smell.  No headaches no generalized body aches no nausea vomiting.Marland Kitchen.   HPI  Past Medical History:  Diagnosis Date  . Anxiety   . Genital herpes   . Obesity, Class III, BMI 40-49.9 (morbid obesity) (HCC)   . PCOS (polycystic ovarian syndrome)   . Prediabetes     Patient Active Problem List   Diagnosis Date Noted  . PCOS (polycystic ovarian syndrome)   . Prediabetes   . Obesity, Class III, BMI 40-49.9 (morbid obesity) (HCC)     Past Surgical History:  Procedure Laterality Date  . KNEE SURGERY Left   . NO PAST SURGERIES      OB History    Gravida  0   Para  0   Term  0   Preterm  0   AB  0   Living  0     SAB  0   TAB  0   Ectopic  0   Multiple  0   Live Births  0            Home Medications    Prior to Admission medications   Medication Sig Start Date End Date Taking? Authorizing Provider  gabapentin (NEURONTIN) 300 MG capsule Take by mouth. 03/04/19 11/09/19 Yes [provider]  JUNEL FE 1/20 1-20 MG-MCG tablet TK 1 T PO QD 04/02/17  Yes [provider]  meloxicam (MOBIC) 15 MG tablet Take 1 tablet (15 mg total) by mouth daily as needed. 05/11/19  Yes Cook, Jayce G, DO  naproxen (NAPROSYN) 500 MG tablet Take by mouth. 03/04/19  Yes [provider]  phentermine (ADIPEX-P) 37.5 MG tablet TK ONE T PO QAM B BRE 03/04/17  Yes [provider]  tiZANidine (ZANAFLEX) 4 MG tablet Take 1 tablet (4 mg total) by mouth every 8 (eight) hours as  needed for muscle spasms. 05/11/19  Yes Cook, Jayce G, DO  acyclovir (ZOVIRAX) 400 MG tablet TAKE 2 TABLETS BY MOUTH THREE TIMES DAILY FOR 2 DAYS 03/04/19   [provider]  metFORMIN (GLUCOPHAGE) 500 MG tablet Take by mouth. 03/04/17 07/26/18  [provider]  ondansetron (ZOFRAN-ODT) 4 MG disintegrating tablet Take 1 tablet (4 mg total) by mouth every 8 (eight) hours as needed for nausea or vomiting. 05/24/19   Tommie Samsook, Jayce G, DO  fluticasone (FLONASE) 50 MCG/ACT nasal spray Place 2 sprays into both nostrils daily. 07/26/18 04/15/19  Domenick GongMortenson, Ashley, MD  traZODone (DESYREL) 50 MG tablet 1-3 tabs po qhs prn insomnia 06/19/18 05/11/19  [provider]    Family History Family History  Problem Relation Age of Onset  . ALS Paternal Aunt   . Lung cancer Paternal Grandfather   . Hypertension Mother     Social History Social History   Tobacco Use  . Smoking status: Current Every Day Smoker    Packs/day: 1.00  . Smokeless tobacco: Never Used  Substance Use Topics  . Alcohol use: Yes    Comment:  occasional  . Drug use: No     Allergies   Latex   Review of Systems Review of Systems  Constitutional: Negative.  Negative for activity change, chills, fatigue and fever.  HENT: Positive for sinus pressure. Negative for congestion.   Respiratory: Negative for cough and chest tightness.   Gastrointestinal: Negative for diarrhea, nausea and vomiting.  Musculoskeletal: Negative for arthralgias, joint swelling and myalgias.     Physical Exam Triage Vital Signs ED Triage Vitals  Enc Vitals Group     BP 10/02/19 1531 (!) 137/104     Pulse Rate 10/02/19 1531 70     Resp 10/02/19 1531 16     Temp 10/02/19 1531 98.4 F (36.9 C)     Temp Source 10/02/19 1531 Oral     SpO2 10/02/19 1531 98 %     Weight 10/02/19 1528 263 lb (119.3 kg)     Height 10/02/19 1528 5\' 2"  (1.575 m)     Head Circumference --      Peak Flow --      Pain Score 10/02/19 1528 0     Pain Loc --       Pain Edu? --      Excl. in [place]? --    No data found.  Updated Vital Signs BP (!) 137/104 (BP Location: Right Arm)   Pulse 70   Temp 98.4 F (36.9 C) (Oral)   Resp 16   Ht 5\' 2"  (1.575 m)   Wt 119.3 kg   LMP 09/04/2019 (Approximate)   SpO2 98%   BMI 48.10 kg/m   Visual Acuity Right Eye Distance:   Left Eye Distance:   Bilateral Distance:    Right Eye Near:   Left Eye Near:    Bilateral Near:     Physical Exam Constitutional:      General: She is not in acute distress.    Appearance: She is not ill-appearing.  HENT:     Right Ear: Tympanic membrane normal.     Left Ear: Tympanic membrane normal.     Nose: No rhinorrhea.     Mouth/Throat:     Mouth: Mucous membranes are moist.     Pharynx: No oropharyngeal exudate or posterior oropharyngeal erythema.  Eyes:     Extraocular Movements: Extraocular movements intact.     Conjunctiva/sclera: Conjunctivae normal.  Cardiovascular:     Rate and Rhythm: Normal rate and regular rhythm.     Pulses: Normal pulses.     Heart sounds: Normal heart sounds.  Pulmonary:     Effort: Pulmonary effort is normal. No respiratory distress.     Breath sounds: Normal breath sounds. No rhonchi or rales.  Neurological:     Mental Status: She is alert.      UC Treatments / Results  Labs (all labs ordered are listed, but only abnormal results are displayed) Labs Reviewed  NOVEL CORONAVIRUS, NAA (HOSP ORDER, SEND-OUT TO REF LAB; TAT 18-24 HRS)    EKG   Radiology No results found.  Procedures Procedures (including critical care time)  Medications Ordered in UC Medications - No data to display  Initial Impression / Assessment and Plan / UC Course  I have reviewed the triage vital signs and the nursing notes.  Pertinent labs & imaging results that were available during my care of the patient were reviewed by me and considered in my medical decision making (see chart for details).     1.  Close exposure to COVID-19  positive individual:  COVID-19 testing done Patient is advised to self isolate until COVID-19 test results are available If patient develops worsening symptoms he is welcome to return to urgent care to be reevaluated. Final Clinical Impressions(s) / UC Diagnoses   Final diagnoses:  Close exposure to COVID-19 virus   Discharge Instructions   None    ED Prescriptions    None     PDMP not reviewed this encounter.   Merrilee Jansky, MD 10/02/19 [phone]

## 2019-10-03 LAB — NOVEL CORONAVIRUS, NAA (HOSP ORDER, SEND-OUT TO REF LAB; TAT 18-24 HRS): SARS-CoV-2, NAA: NOT DETECTED

## 2019-10-07 ENCOUNTER — Other Ambulatory Visit: Payer: Self-pay

## 2019-10-07 ENCOUNTER — Encounter: Payer: Self-pay | Admitting: Emergency Medicine

## 2019-10-07 ENCOUNTER — Ambulatory Visit
Admission: EM | Admit: 2019-10-07 | Discharge: 2019-10-07 | Disposition: A | Discharge status: Home / Self Care | Payer: BLUE CROSS/BLUE SHIELD | Attending: Emergency Medicine | Admitting: Emergency Medicine

## 2019-10-07 DIAGNOSIS — U071 COVID-19: Secondary | ICD-10-CM | POA: Diagnosis not present

## 2019-10-07 LAB — SARS CORONAVIRUS 2 AG (30 MIN TAT): SARS Coronavirus 2 Ag: POSITIVE — AB

## 2019-10-07 NOTE — ED Provider Notes (Signed)
[facility] ____________________________________________  Time seen: Approximately 2:27 PM  I have reviewed the triage vital signs and the nursing notes.   HISTORY  Chief Complaint Fever   HPI Amber Keller is a 33 y.o. female presenting for evaluation of 2 days of nasal congestion, occasional cough, occasional diarrhea and low-grade fever.  Reports T-max 100.4.  Reports on Christmas eve she was exposed to someone with COVID-19 and had a negative COVID-19 testing on 12/26.  Reports symptom onset 2 days ago.  States overall she feels okay.  Has not been taking any over-the-counter medication for the same complaints.  Denies other known sick contacts.  Denies vomiting, abdominal pain, chest pain, shortness of breath, sore throat.  Denies changes in taste or smell.  Denies other recent sickness.  Denies history of hypertension, diabetes or asthma. Denies pregnancy  [place] : PCP  Past Medical History:  Diagnosis Date   Anxiety    Genital herpes    Obesity, Class III, BMI 40-49.9 (morbid obesity) (HCC)    PCOS (polycystic ovarian syndrome)    Prediabetes     Patient Active Problem List   Diagnosis Date Noted   PCOS (polycystic ovarian syndrome)    Prediabetes    Obesity, Class III, BMI 40-49.9 (morbid obesity) ([place])     Past Surgical History:  Procedure Laterality Date   KNEE SURGERY Left    NO PAST SURGERIES       No current facility-administered medications for this encounter.  Current Outpatient Medications:    acyclovir (ZOVIRAX) 400 MG tablet, TAKE 2 TABLETS BY MOUTH THREE TIMES DAILY FOR 2 DAYS, Disp: , Rfl:    gabapentin (NEURONTIN) 300 MG capsule, Take by mouth., Disp: , Rfl:    JUNEL FE 1/20 1-20 MG-MCG tablet, TK 1 T PO QD, Disp: , Rfl: 3   meloxicam (MOBIC) 15 MG tablet, Take 1 tablet (15 mg total) by mouth daily as needed., Disp: 30 tablet, Rfl: 0   naproxen (NAPROSYN) 500 MG tablet, Take by mouth., Disp: ,  Rfl:    ondansetron (ZOFRAN-ODT) 4 MG disintegrating tablet, Take 1 tablet (4 mg total) by mouth every 8 (eight) hours as needed for nausea or vomiting., Disp: 20 tablet, Rfl: 0   phentermine (ADIPEX-P) 37.5 MG tablet, TK ONE T PO QAM B BRE, Disp: , Rfl: 0   tiZANidine (ZANAFLEX) 4 MG tablet, Take 1 tablet (4 mg total) by mouth every 8 (eight) hours as needed for muscle spasms., Disp: 30 tablet, Rfl: 0   metFORMIN (GLUCOPHAGE) 500 MG tablet, Take by mouth., Disp: , Rfl:   Allergies Latex  Family History  Problem Relation Age of Onset   ALS Paternal 30    Lung cancer Paternal Grandfather    Hypertension Mother     Social History Social History   Tobacco Use   Smoking status: Current Every Day Smoker    Packs/day: 1.00   Smokeless tobacco: Never Used  Substance Use Topics   Alcohol use: Yes    Comment: occasional   Drug use: No    Review of Systems Constitutional: Positive fevers. ENT: No sore throat.  As above. Cardiovascular: Denies chest pain. Respiratory: Denies shortness of breath. Gastrointestinal: No abdominal pain.  Genitourinary: Negative for dysuria. Musculoskeletal: Negative for back pain. Skin: Negative for rash.   ____________________________________________   PHYSICAL EXAM:  VITAL SIGNS: ED Triage Vitals  Enc Vitals Group     BP 10/07/19 1244 (!) 118/96     Pulse Rate  10/07/19 1244 (!) 104     Resp 10/07/19 1244 20     Temp 10/07/19 1244 99 F (37.2 C)     Temp Source 10/07/19 1244 Oral     SpO2 10/07/19 1244 97 %     Weight 10/07/19 1239 263 lb (119.3 kg)     Height 10/07/19 1239 5\' 2"  (1.575 m)     Head Circumference --      Peak Flow --      Pain Score 10/07/19 1239 0     Pain Loc --      Pain Edu? --      Excl. in [place]? --     Constitutional: Alert and oriented. Well appearing and in no acute distress. Eyes: Conjunctivae are normal.  ENT      Head: Normocephalic and atraumatic.      Nose: Nasal  congestion. Cardiovascular: Tachycardic.Good peripheral circulation. Respiratory: Normal respiratory effort without tachypnea nor retractions. Breath sounds are clear and equal bilaterally. No wheezes, rales, rhonchi. Musculoskeletal: Steady gait. Neurologic:  Normal speech and language. Speech is normal. No gait instability.  Skin:  Skin is warm, dry and intact. No rash noted. Psychiatric: Mood and affect are normal. Speech and behavior are normal. Patient exhibits appropriate insight and judgment   ___________________________________________   LABS (all labs ordered are listed, but only abnormal results are displayed)  Labs Reviewed  SARS CORONAVIRUS 2 AG (30 MIN TAT) - Abnormal; Notable for the following components:      Result Value   SARS Coronavirus 2 Ag POSITIVE (*)    All other components within normal limits     PROCEDURES Procedures    INITIAL IMPRESSION / ASSESSMENT AND PLAN / ED COURSE  Pertinent labs & imaging results that were available during my care of the patient were reviewed by me and considered in my medical decision making (see chart for details).  Overall well-appearing patient.  No acute distress.  Suspect viral illness.  COVID-19 rapid test positive.  Encourage rest, fluids, supportive care, over-the-counter medication as needed.  Work note given.  Discussed her follow-up and return parameters.  Discussed follow up and return parameters including no resolution or any worsening concerns. Patient verbalized understanding and agreed to plan.   ____________________________________________   FINAL CLINICAL IMPRESSION(S) / ED DIAGNOSES  Final diagnoses:  COVID-19     ED Discharge Orders    None       Note: This dictation was prepared with Dragon dictation along with smaller phrase technology. Any transcriptional errors that result from this process are unintentional.         10/09/19, NP 10/07/19 1454

## 2019-10-07 NOTE — Discharge Instructions (Addendum)
Rest. Drink plenty of fluids. Remain home unless seeking further care.   Follow up with your primary care physician this week as needed. Return to Urgent or ER care for new or worsening concerns.

## 2019-10-07 NOTE — ED Triage Notes (Signed)
Pt c/o "low grade fever (100.4), diarrhea, nasal congestion, and feels cold air in her chest. Started 2 days ago. She was exposed to someone with covid about 6 days ago. She was tested on 10/02/19 and was negative.

## 2019-10-08 ENCOUNTER — Encounter: Payer: Self-pay | Admitting: Infectious Diseases

## 2019-10-08 DIAGNOSIS — U071 COVID-19: Secondary | ICD-10-CM

## 2019-10-08 HISTORY — DX: COVID-19: U07.1

## 2019-11-10 ENCOUNTER — Ambulatory Visit
Admission: EM | Admit: 2019-11-10 | Discharge: 2019-11-10 | Disposition: A | Discharge status: Home / Self Care | Payer: BLUE CROSS/BLUE SHIELD | Attending: Family Medicine | Admitting: Family Medicine

## 2019-11-10 ENCOUNTER — Other Ambulatory Visit: Payer: Self-pay

## 2019-11-10 DIAGNOSIS — M545 Low back pain, unspecified: Secondary | ICD-10-CM

## 2019-11-10 MED ORDER — ETODOLAC 500 MG PO TABS: 500.0000 mg | ORAL_TABLET | Freq: Two times a day (BID) | ORAL | 1 refills | Status: DC | PRN

## 2019-11-10 MED ORDER — KETOROLAC TROMETHAMINE 60 MG/2ML IM SOLN
60.0000 mg | Freq: Once | INTRAMUSCULAR | Status: AC
  Administered 2019-11-10: 14:00:00 60 mg via INTRAMUSCULAR

## 2019-11-10 MED ORDER — TRAMADOL HCL 50 MG PO TABS: 50.0000 mg | ORAL_TABLET | Freq: Three times a day (TID) | ORAL | 0 refills | Status: DC | PRN

## 2019-11-10 NOTE — Discharge Instructions (Signed)
Weight loss.  Consider physical therapy.  Medication as directed.  Take care  Dr. Adriana Simas

## 2019-11-10 NOTE — ED Triage Notes (Signed)
Pt presents with c/o left-sided LBP. She does have hx of sciatica pain on that side. She reports flare started this morning. She has pain with movement.

## 2019-11-10 NOTE — ED Provider Notes (Signed)
[facility]    CSN: [id number] Arrival date & time: 11/10/19  1225      History   Chief Complaint Back pain  HPI  34 year old female presents with low back pain.  Patient has known chronic back pain.  She has known degenerative disc disease.  Patient reports worsening back pain as of this morning.  Pain is located predominantly on the left side of the low back.  Patient reports her pain is 7/10 in severity.  Described as sharp and aching.  Worse with palpation and certain activities.  She has been at work and she is required to bend and stoop often.  This seems to exacerbate the pain.  She takes naproxen on a regular basis without significant improvement.  No saddle anesthesia or incontinence.  No red flags.  No relieving factors.  No other complaints.  Past Medical History:  Diagnosis Date  . Anxiety   . Genital herpes   . Obesity, Class III, BMI 40-49.9 (morbid obesity) (HCC)   . PCOS (polycystic ovarian syndrome)   . Prediabetes     Patient Active Problem List   Diagnosis Date Noted  . PCOS (polycystic ovarian syndrome)   . Prediabetes   . Obesity, Class III, BMI 40-49.9 (morbid obesity) (HCC)     Past Surgical History:  Procedure Laterality Date  . KNEE SURGERY Left   . NO PAST SURGERIES      OB History    Gravida  0   Para  0   Term  0   Preterm  0   AB  0   Living  0     SAB  0   TAB  0   Ectopic  0   Multiple  0   Live Births  0            Home Medications    Prior to Admission medications   Medication Sig Start Date End Date Taking? Authorizing Provider  acyclovir (ZOVIRAX) 400 MG tablet TAKE 2 TABLETS BY MOUTH THREE TIMES DAILY FOR 2 DAYS 03/04/19  Yes [provider]  gabapentin (NEURONTIN) 300 MG capsule Take 1 capsule by mouth 3 (three) times daily. 03/04/19  Yes [provider]  JUNEL FE 1/20 1-20 MG-MCG tablet TK 1 T PO QD 04/02/17  Yes [provider]  metFORMIN (GLUCOPHAGE) 500 MG tablet  Take by mouth. 03/04/17 11/10/19 Yes [provider]  naproxen (NAPROSYN) 500 MG tablet Take by mouth. 03/04/19  Yes [provider]  phentermine (ADIPEX-P) 37.5 MG tablet TK ONE T PO QAM B BRE 03/04/17  Yes [provider]  etodolac (LODINE) 500 MG tablet Take 1 tablet (500 mg total) by mouth 2 (two) times daily as needed. 11/10/19   Tommie Sams, DO  traMADol (ULTRAM) 50 MG tablet Take 1 tablet (50 mg total) by mouth every 8 (eight) hours as needed. 11/10/19   Tommie Sams, DO  fluticasone (FLONASE) 50 MCG/ACT nasal spray Place 2 sprays into both nostrils daily. 07/26/18 04/15/19  Domenick Gong, MD  traZODone (DESYREL) 50 MG tablet 1-3 tabs po qhs prn insomnia 06/19/18 05/11/19  [provider]    Family History Family History  Problem Relation Age of Onset  . ALS Paternal Aunt   . Lung cancer Paternal Grandfather   . Hypertension Mother     Social History Social History   Tobacco Use  . Smoking status: Current Every Day Smoker    Packs/day: 1.00  . Smokeless tobacco:  Never Used  Substance Use Topics  . Alcohol use: Yes    Comment: occasional  . Drug use: No   Allergies   Latex  Review of Systems Review of Systems  Constitutional: Negative.   Musculoskeletal: Positive for back pain.   Physical Exam Triage Vital Signs ED Triage Vitals  Enc Vitals Group     BP 11/10/19 1245 (!) 120/92     Pulse Rate 11/10/19 1245 78     Resp --      Temp 11/10/19 1245 98.4 F (36.9 C)     Temp Source 11/10/19 1245 Oral     SpO2 11/10/19 1245 98 %     Weight 11/10/19 1243 263 lb (119.3 kg)     Height 11/10/19 1243 5\' 2"  (1.575 m)     Head Circumference --      Peak Flow --      Pain Score 11/10/19 1242 7     Pain Loc --      Pain Edu? --      Excl. in [place]? --    Updated Vital Signs BP (!) 120/92 (BP Location: Left Arm)   Pulse 78   Temp 98.4 F (36.9 C) (Oral)   Ht 5\' 2"  (1.575 m)   Wt 119.3 kg   LMP 10/20/2019 (Approximate)   SpO2 98%   BMI  48.10 kg/m   Visual Acuity Right Eye Distance:   Left Eye Distance:   Bilateral Distance:    Right Eye Near:   Left Eye Near:    Bilateral Near:     Physical Exam Vitals and nursing note reviewed.  Constitutional:      General: She is not in acute distress.    Appearance: Normal appearance. She is obese. She is not ill-appearing.  HENT:     Head: Normocephalic and atraumatic.  Eyes:     General:        Right eye: No discharge.        Left eye: No discharge.     Conjunctiva/sclera: Conjunctivae normal.  Cardiovascular:     Rate and Rhythm: Normal rate and regular rhythm.     Heart sounds: No murmur.  Pulmonary:     Effort: Pulmonary effort is normal.     Breath sounds: Normal breath sounds. No wheezing, rhonchi or rales.  Musculoskeletal:     Comments: Lumbar spine -left-sided paraspinal musculature tenderness to palpation.  Neurological:     Mental Status: She is alert.  Psychiatric:        Mood and Affect: Mood normal.        Behavior: Behavior normal.    UC Treatments / Results  Labs (all labs ordered are listed, but only abnormal results are displayed) Labs Reviewed - No data to display  EKG   Radiology No results found.  Procedures Procedures (including critical care time)  Medications Ordered in UC Medications  ketorolac (TORADOL) injection 60 mg (60 mg Intramuscular Given 11/10/19 1354)    Initial Impression / Assessment and Plan / UC Course  I have reviewed the triage vital signs and the nursing notes.  Pertinent labs & imaging results that were available during my care of the patient were reviewed by me and considered in my medical decision making (see chart for details).    34 year old female presents with acute on chronic back pain.  Advised weight loss.  Prescription given for physical therapy if desired.  Patient concerned about cost regarding physical therapy.  Tramadol as needed for moderate  to severe pain.  Etodolac twice daily as needed.   Restrictions given for work for the next 3 days.    Final Clinical Impressions(s) / UC Diagnoses   Final diagnoses:  Acute left-sided low back pain, unspecified whether sciatica present     Discharge Instructions     Weight loss.  Consider physical therapy.  Medication as directed.  Take care  Dr. Lacinda Axon    ED Prescriptions    Medication Sig Dispense Auth. Provider   traMADol (ULTRAM) 50 MG tablet Take 1 tablet (50 mg total) by mouth every 8 (eight) hours as needed. 15 tablet Mykira Hofmeister G, DO   etodolac (LODINE) 500 MG tablet Take 1 tablet (500 mg total) by mouth 2 (two) times daily as needed. 30 tablet Thersa Salt G, DO     I have reviewed the PDMP during this encounter.   Coral Spikes, [place] 11/10/19 1523

## 2019-12-14 ENCOUNTER — Other Ambulatory Visit: Payer: Self-pay

## 2019-12-14 ENCOUNTER — Ambulatory Visit
Admission: EM | Admit: 2019-12-14 | Discharge: 2019-12-14 | Disposition: A | Discharge status: Home / Self Care | Payer: 59

## 2020-01-30 ENCOUNTER — Ambulatory Visit
Admission: EM | Admit: 2020-01-30 | Discharge: 2020-01-30 | Disposition: A | Discharge status: Home / Self Care | Payer: 59 | Attending: Family Medicine | Admitting: Family Medicine

## 2020-01-30 ENCOUNTER — Ambulatory Visit (INDEPENDENT_AMBULATORY_CARE_PROVIDER_SITE_OTHER): Payer: 59

## 2020-01-30 ENCOUNTER — Other Ambulatory Visit: Payer: Self-pay

## 2020-01-30 ENCOUNTER — Encounter: Payer: Self-pay | Admitting: Emergency Medicine

## 2020-01-30 DIAGNOSIS — S60032A Contusion of left middle finger without damage to nail, initial encounter: Secondary | ICD-10-CM | POA: Diagnosis not present

## 2020-01-30 DIAGNOSIS — S6000XA Contusion of unspecified finger without damage to nail, initial encounter: Secondary | ICD-10-CM | POA: Diagnosis not present

## 2020-01-30 MED ORDER — HYDROCODONE-ACETAMINOPHEN 5-325 MG PO TABS: ORAL_TABLET | ORAL | 0 refills | Status: DC

## 2020-01-30 NOTE — ED Triage Notes (Signed)
Patient c/o pain in her left 3rd and 4th fingers.  Patient states that she hit her fingers on the kitchen countertop this morning.

## 2020-01-30 NOTE — Discharge Instructions (Signed)
Rest, ice , ibuprofen.

## 2020-01-30 NOTE — ED Provider Notes (Signed)
[facility]    CSN: [id number] Arrival date & time: 01/30/20  1435      History   Chief Complaint Chief Complaint  Patient presents with  . Finger Injury    HPI Amber Keller is a 34 y.o. female.   35 yo female with a c/o pain to the 3rd and 4rd fingers after hitting her fingers on the kitchen countertop this morning. States pain is mainly on the 3rd finger. Denies any numbness/tingling, discoloration.      Past Medical History:  Diagnosis Date  . Anxiety   . Genital herpes   . Obesity, Class III, BMI 40-49.9 (morbid obesity) (HCC)   . PCOS (polycystic ovarian syndrome)   . Prediabetes     Patient Active Problem List   Diagnosis Date Noted  . PCOS (polycystic ovarian syndrome)   . Prediabetes   . Obesity, Class III, BMI 40-49.9 (morbid obesity) (HCC)     Past Surgical History:  Procedure Laterality Date  . KNEE SURGERY Left   . NO PAST SURGERIES      OB History    Gravida  0   Para  0   Term  0   Preterm  0   AB  0   Living  0     SAB  0   TAB  0   Ectopic  0   Multiple  0   Live Births  0            Home Medications    Prior to Admission medications   Medication Sig Start Date End Date Taking? Authorizing Provider  etodolac (LODINE) 500 MG tablet Take 1 tablet (500 mg total) by mouth 2 (two) times daily as needed. 11/10/19  Yes Cook, Jayce G, DO  gabapentin (NEURONTIN) 300 MG capsule Take 1 capsule by mouth 3 (three) times daily. 03/04/19  Yes [provider]  JUNEL FE 1/20 1-20 MG-MCG tablet TK 1 T PO QD 04/02/17  Yes [provider]  metFORMIN (GLUCOPHAGE) 500 MG tablet Take by mouth. 03/04/17 01/30/20 Yes [provider]  phentermine (ADIPEX-P) 37.5 MG tablet TK ONE T PO QAM B BRE 03/04/17  Yes [provider]  traMADol (ULTRAM) 50 MG tablet Take 1 tablet (50 mg total) by mouth every 8 (eight) hours as needed. 11/10/19  Yes Cook, Jayce G, DO  acyclovir (ZOVIRAX) 400 MG tablet TAKE  2 TABLETS BY MOUTH THREE TIMES DAILY FOR 2 DAYS 03/04/19   [provider]  HYDROcodone-acetaminophen (NORCO/VICODIN) 5-325 MG tablet 1-2 tabs po bid prn 01/30/20   Payton Mccallum, MD  naproxen (NAPROSYN) 500 MG tablet Take by mouth. 03/04/19   [provider]  fluticasone (FLONASE) 50 MCG/ACT nasal spray Place 2 sprays into both nostrils daily. 07/26/18 04/15/19  Domenick Gong, MD  traZODone (DESYREL) 50 MG tablet 1-3 tabs po qhs prn insomnia 06/19/18 05/11/19  [provider]    Family History Family History  Problem Relation Age of Onset  . ALS Paternal Aunt   . Lung cancer Paternal Grandfather   . Hypertension Mother     Social History Social History   Tobacco Use  . Smoking status: Current Every Day Smoker    Packs/day: 1.00  . Smokeless tobacco: Never Used  Substance Use Topics  . Alcohol use: Yes    Comment: occasional  . Drug use: No     Allergies   Latex   Review of Systems Review of Systems   Physical Exam Triage  Vital Signs ED Triage Vitals  Enc Vitals Group     BP 01/30/20 1449 (!) 141/100     Pulse Rate 01/30/20 1449 86     Resp 01/30/20 1449 14     Temp 01/30/20 1449 98 F (36.7 C)     Temp Source 01/30/20 1449 Oral     SpO2 01/30/20 1449 99 %     Weight 01/30/20 1445 263 lb (119.3 kg)     Height 01/30/20 1445 5\' 2"  (1.575 m)     Head Circumference --      Peak Flow --      Pain Score 01/30/20 1445 7     Pain Loc --      Pain Edu? --      Excl. in [place]? --    No data found.  Updated Vital Signs BP (!) 141/100 (BP Location: Right Arm)   Pulse 86   Temp 98 F (36.7 C) (Oral)   Resp 14   Ht 5\' 2"  (1.575 m)   Wt 119.3 kg   LMP 01/28/2020 (Exact Date)   SpO2 99%   BMI 48.10 kg/m   Visual Acuity Right Eye Distance:   Left Eye Distance:   Bilateral Distance:    Right Eye Near:   Left Eye Near:    Bilateral Near:     Physical Exam Vitals and nursing note reviewed.  Constitutional:      General: She is not  in acute distress.    Appearance: She is not toxic-appearing or diaphoretic.  Musculoskeletal:     Left hand: Swelling, tenderness and bony tenderness present. Normal capillary refill. Normal pulse.     Comments: Left hand 3rd finger diffuse, mild swelling noted with tenderness to palpation; no gross deformity; left hand neurovascularly intact  Neurological:     Mental Status: She is alert.      UC Treatments / Results  Labs (all labs ordered are listed, but only abnormal results are displayed) Labs Reviewed - No data to display  EKG   Radiology DG Hand Complete Left  Result Date: 01/30/2020 CLINICAL DATA:  Acute LEFT hand pain following injury today. Initial encounter. EXAM: LEFT HAND - COMPLETE 3+ VIEW COMPARISON:  None. FINDINGS: There is no evidence of fracture or dislocation. There is no evidence of arthropathy or other focal bone abnormality. Soft tissues are unremarkable. IMPRESSION: Negative. Electronically Signed   By: Margarette Canada M.D.   On: 01/30/2020 15:21    Procedures Procedures (including critical care time)  Medications Ordered in UC Medications - No data to display  Initial Impression / Assessment and Plan / UC Course  I have reviewed the triage vital signs and the nursing notes.  Pertinent labs & imaging results that were available during my care of the patient were reviewed by me and considered in my medical decision making (see chart for details).      Final Clinical Impressions(s) / UC Diagnoses   Final diagnoses:  Contusion of finger of left hand, unspecified finger, initial encounter     Discharge Instructions     Rest, ice, ibuprofen    ED Prescriptions    Medication Sig Dispense Auth. Provider   HYDROcodone-acetaminophen (NORCO/VICODIN) 5-325 MG tablet 1-2 tabs po bid prn 6 tablet Norval Gable, MD      1. Hand x-ray results (negative for fracture or dislocation) and diagnosis reviewed with patient 2. rx as per orders above; reviewed  possible side effects, interactions, risks and benefits  3. Recommend supportive  treatment with rest, ice, otc analgesics prn 4. Follow-up prn if symptoms worsen or don't improve   I have reviewed the PDMP during this encounter.   Payton Mccallum, MD 01/30/20 1556

## 2020-03-18 ENCOUNTER — Encounter: Payer: Self-pay | Admitting: Emergency Medicine

## 2020-03-18 ENCOUNTER — Inpatient Hospital Stay: Admission: RE | Admit: 2020-03-18 | Payer: Self-pay | Source: Ambulatory Visit

## 2020-03-18 ENCOUNTER — Other Ambulatory Visit: Payer: Self-pay

## 2020-03-18 ENCOUNTER — Ambulatory Visit
Admission: EM | Admit: 2020-03-18 | Discharge: 2020-03-18 | Disposition: A | Discharge status: Home / Self Care | Payer: 59 | Attending: Emergency Medicine | Admitting: Emergency Medicine

## 2020-03-18 DIAGNOSIS — R519 Headache, unspecified: Secondary | ICD-10-CM | POA: Insufficient documentation

## 2020-03-18 DIAGNOSIS — J029 Acute pharyngitis, unspecified: Secondary | ICD-10-CM | POA: Insufficient documentation

## 2020-03-18 DIAGNOSIS — R0981 Nasal congestion: Secondary | ICD-10-CM | POA: Diagnosis present

## 2020-03-18 DIAGNOSIS — Z20822 Contact with and (suspected) exposure to covid-19: Secondary | ICD-10-CM | POA: Insufficient documentation

## 2020-03-18 DIAGNOSIS — Z791 Long term (current) use of non-steroidal anti-inflammatories (NSAID): Secondary | ICD-10-CM | POA: Diagnosis not present

## 2020-03-18 DIAGNOSIS — F17210 Nicotine dependence, cigarettes, uncomplicated: Secondary | ICD-10-CM | POA: Insufficient documentation

## 2020-03-18 DIAGNOSIS — J209 Acute bronchitis, unspecified: Secondary | ICD-10-CM

## 2020-03-18 DIAGNOSIS — R062 Wheezing: Secondary | ICD-10-CM

## 2020-03-18 DIAGNOSIS — Z79899 Other long term (current) drug therapy: Secondary | ICD-10-CM | POA: Insufficient documentation

## 2020-03-18 DIAGNOSIS — Z7984 Long term (current) use of oral hypoglycemic drugs: Secondary | ICD-10-CM | POA: Insufficient documentation

## 2020-03-18 DIAGNOSIS — R7303 Prediabetes: Secondary | ICD-10-CM | POA: Insufficient documentation

## 2020-03-18 DIAGNOSIS — Z6841 Body Mass Index (BMI) 40.0 and over, adult: Secondary | ICD-10-CM | POA: Diagnosis not present

## 2020-03-18 MED ORDER — PROMETHAZINE-CODEINE 6.25-10 MG/5ML PO SYRP: 5.0000 mL | ORAL_SOLUTION | Freq: Three times a day (TID) | ORAL | 0 refills | Status: DC | PRN

## 2020-03-18 MED ORDER — BENZONATATE 100 MG PO CAPS: 100.0000 mg | ORAL_CAPSULE | Freq: Three times a day (TID) | ORAL | 0 refills | Status: DC | PRN

## 2020-03-18 MED ORDER — PREDNISONE 10 MG (21) PO TBPK: ORAL_TABLET | ORAL | 0 refills | Status: DC

## 2020-03-18 MED ORDER — ALBUTEROL SULFATE HFA 108 (90 BASE) MCG/ACT IN AERS: 2.0000 | INHALATION_SPRAY | Freq: Four times a day (QID) | RESPIRATORY_TRACT | 0 refills | Status: DC | PRN

## 2020-03-18 NOTE — ED Triage Notes (Signed)
Patient c/o sneezing, nasal congestion and cough that started 3 days ago. Patient denies fevers.

## 2020-03-18 NOTE — Discharge Instructions (Addendum)
Recommend start Prednisone 10mg  tablets- take 6 tablets today and decrease by 1 tablet each day until finished on day 6. May take Tessalon cough pills- 1 every 8 hours as needed. Use Albuterol inhaler 2 puffs every 4 to 6 hours as needed for cough or wheezing. Increase fluids to help loosen up mucus in chest. May take Cough medication with codeine 1 teaspoon every 8 hours as needed- use mainly at night for cough. Rest. Stay at home. Follow-up pending COVID 19 test results and in 3 to 4 days with your PCP if not improving.

## 2020-03-18 NOTE — ED Provider Notes (Signed)
[facility]    CSN: [id number] Arrival date & time: 03/18/20  1002      History   Chief Complaint Chief Complaint  Patient presents with   Nasal Congestion   Cough    HPI Amber Keller is a 34 y.o. female.   34 year old female presents with nasal congestion, sneezing, sore throat, headache and cough for the past 3 days.  Has been coughing up greenish discolored mucus.  Denies any fever or GI symptoms.  No other family members sick.  No history of asthma but does smoke tobacco.  Has tried Alka-Seltzer and various OTC cold and flu medications with minimal relief.  Was positive for COVID-19 in December 2020 but symptoms do not feel similar.  Other chronic health issues include PCOS and currently on Metformin, Junel 1/20, Gabapentin daily and acyclovir as needed.  The history is provided by the patient.    Past Medical History:  Diagnosis Date   Anxiety    Genital herpes    Obesity, Class III, BMI 40-49.9 (morbid obesity) (HCC)    PCOS (polycystic ovarian syndrome)    Prediabetes     Patient Active Problem List   Diagnosis Date Noted   PCOS (polycystic ovarian syndrome)    Prediabetes    Obesity, Class III, BMI 40-49.9 (morbid obesity) (HCC)     Past Surgical History:  Procedure Laterality Date   KNEE SURGERY Left    NO PAST SURGERIES      OB History    Gravida  0   Para  0   Term  0   Preterm  0   AB  0   Living  0     SAB  0   TAB  0   Ectopic  0   Multiple  0   Live Births  0            Home Medications    Prior to Admission medications   Medication Sig Start Date End Date Taking? Authorizing Provider  etodolac (LODINE) 500 MG tablet Take 1 tablet (500 mg total) by mouth 2 (two) times daily as needed. 11/10/19  Yes Cook, Jayce G, DO  gabapentin (NEURONTIN) 300 MG capsule Take 1 capsule by mouth 3 (three) times daily. 03/04/19  Yes [provider]  JUNEL FE 1/20 1-20 MG-MCG tablet TK 1 T PO QD  04/02/17  Yes [provider]  phentermine (ADIPEX-P) 37.5 MG tablet TK ONE T PO QAM B BRE 03/04/17  Yes [provider]  acyclovir (ZOVIRAX) 400 MG tablet TAKE 2 TABLETS BY MOUTH THREE TIMES DAILY FOR 2 DAYS 03/04/19   [provider]  albuterol (VENTOLIN HFA) 108 (90 Base) MCG/ACT inhaler Inhale 2 puffs into the lungs every 6 (six) hours as needed for wheezing or shortness of breath. 03/18/20   Sudie Grumbling, NP  benzonatate (TESSALON) 100 MG capsule Take 1 capsule (100 mg total) by mouth 3 (three) times daily as needed for cough. 03/18/20   Sudie Grumbling, NP  metFORMIN (GLUCOPHAGE) 500 MG tablet Take by mouth. 03/04/17 01/30/20  [provider]  naproxen (NAPROSYN) 500 MG tablet Take by mouth. 03/04/19   [provider]  predniSONE (STERAPRED UNI-PAK 21 TAB) 10 MG (21) TBPK tablet Take 6 tabs by mouth daily on day 1 then decrease by 1 tablet each day until finished on day 6. 03/18/20   Caryl Fate, Ali Lowe, NP  promethazine-codeine (PHENERGAN WITH CODEINE) 6.25-10 MG/5ML syrup Take 5 mLs by mouth  every 8 (eight) hours as needed for cough. 03/18/20   Katy Apo, NP  traMADol (ULTRAM) 50 MG tablet Take 1 tablet (50 mg total) by mouth every 8 (eight) hours as needed. 11/10/19   Coral Spikes, DO  fluticasone (FLONASE) 50 MCG/ACT nasal spray Place 2 sprays into both nostrils daily. 07/26/18 04/15/19  Melynda Ripple, MD  traZODone (DESYREL) 50 MG tablet 1-3 tabs po qhs prn insomnia 06/19/18 05/11/19  [provider]    Family History Family History  Problem Relation Age of Onset   ALS Paternal 40    Lung cancer Paternal Grandfather    Hypertension Mother     Social History Social History   Tobacco Use   Smoking status: Current Every Day Smoker    Packs/day: 1.00   Smokeless tobacco: Never Used  Vaping Use   Vaping Use: Never used  Substance Use Topics   Alcohol use: Yes    Comment: occasional   Drug use: No     Allergies     Latex   Review of Systems Review of Systems  Constitutional: Positive for activity change, chills and fatigue. Negative for fever.  HENT: Positive for congestion, postnasal drip, rhinorrhea, sinus pressure, sinus pain, sneezing and sore throat. Negative for ear discharge, ear pain, facial swelling, nosebleeds and trouble swallowing.   Eyes: Negative for pain, discharge, redness and itching.  Respiratory: Positive for cough and wheezing. Negative for chest tightness and stridor.   Gastrointestinal: Negative for diarrhea, nausea and vomiting.  Musculoskeletal: Positive for arthralgias and myalgias. Negative for neck pain and neck stiffness.  Skin: Negative for color change, rash and wound.  Allergic/Immunologic: Negative for environmental allergies, food allergies and immunocompromised state.  Neurological: Positive for headaches. Negative for dizziness, seizures, syncope, weakness and light-headedness.  Hematological: Negative for adenopathy. Does not bruise/bleed easily.  Psychiatric/Behavioral: Positive for sleep disturbance.     Physical Exam Triage Vital Signs ED Triage Vitals  Enc Vitals Group     BP 03/18/20 1023 (!) 139/93     Pulse Rate 03/18/20 1023 70     Resp 03/18/20 1023 16     Temp 03/18/20 1023 98.7 F (37.1 C)     Temp Source 03/18/20 1023 Oral     SpO2 03/18/20 1023 99 %     Weight 03/18/20 1020 263 lb (119.3 kg)     Height 03/18/20 1020 5\' 2"  (1.575 m)     Head Circumference --      Peak Flow --      Pain Score 03/18/20 1020 0     Pain Loc --      Pain Edu? --      Excl. in [place]? --    No data found.  Updated Vital Signs BP (!) 139/93 (BP Location: Left Arm)    Pulse 70    Temp 98.7 F (37.1 C) (Oral)    Resp 16    Ht 5\' 2"  (1.575 m)    Wt 263 lb (119.3 kg)    LMP 02/16/2020 (Approximate)    SpO2 99%    BMI 48.10 kg/m   Visual Acuity Right Eye Distance:   Left Eye Distance:   Bilateral Distance:    Right Eye Near:   Left Eye Near:    Bilateral  Near:     Physical Exam Vitals and nursing note reviewed.  Constitutional:      General: She is awake. She is not in acute distress.    Appearance: She is well-developed  and overweight. She is ill-appearing.     Comments: She is sitting on the exam table in no acute distress but appears ill.   HENT:     Head: Normocephalic and atraumatic.     Right Ear: Hearing, ear canal and external ear normal. Tympanic membrane is bulging.     Left Ear: Hearing, ear canal and external ear normal. Tympanic membrane is bulging.     Nose: Congestion and rhinorrhea present. Rhinorrhea is clear.     Right Sinus: No maxillary sinus tenderness or frontal sinus tenderness.     Left Sinus: No maxillary sinus tenderness or frontal sinus tenderness.     Mouth/Throat:     Lips: Pink.     Mouth: Mucous membranes are moist.     Pharynx: Uvula midline. Oropharyngeal exudate and posterior oropharyngeal erythema present. No pharyngeal swelling or uvula swelling.     Comments: Slight clear post nasal drainage present.  Eyes:     Extraocular Movements: Extraocular movements intact.     Conjunctiva/sclera: Conjunctivae normal.  Cardiovascular:     Rate and Rhythm: Normal rate and regular rhythm.     Heart sounds: Normal heart sounds. No murmur heard.   Pulmonary:     Effort: Pulmonary effort is normal. No respiratory distress.     Breath sounds: Normal air entry. No decreased air movement. Examination of the right-upper field reveals wheezing and rhonchi. Examination of the left-upper field reveals wheezing and rhonchi. Examination of the right-middle field reveals wheezing. Examination of the right-lower field reveals wheezing. Examination of the left-lower field reveals wheezing. Wheezing and rhonchi present. No decreased breath sounds or rales.     Comments: Course breath sounds in both upper fields.  Musculoskeletal:     Cervical back: Normal range of motion and neck supple. No tenderness.  Lymphadenopathy:      Cervical: No cervical adenopathy.  Skin:    General: Skin is warm and dry.     Capillary Refill: Capillary refill takes less than 2 seconds.     Findings: No rash.  Neurological:     General: No focal deficit present.     Mental Status: She is alert and oriented to person, place, and time.  Psychiatric:        Mood and Affect: Mood normal. Affect is flat.        Speech: Speech normal.        Behavior: Behavior normal. Behavior is cooperative.        Thought Content: Thought content normal.        Judgment: Judgment normal.      UC Treatments / Results  Labs (all labs ordered are listed, but only abnormal results are displayed) Labs Reviewed  SARS CORONAVIRUS 2 (TAT 6-24 HRS)    EKG   Radiology No results found.  Procedures Procedures (including critical care time)  Medications Ordered in UC Medications - No data to display  Initial Impression / Assessment and Plan / UC Course  I have reviewed the triage vital signs and the nursing notes.  Pertinent labs & imaging results that were available during my care of the patient were reviewed by me and considered in my medical decision making (see chart for details).    Reviewed with patient that she probably has a viral bronchitis with wheezing. Recommend start Albuterol 2 puffs every 4 to 6 hours as needed for cough or wheezing.  Take Tessalon cough pills-1 every 8 hours as needed.  Increase fluids to help loosen up  mucus in chest.  Encouraged to D/C smoking.  Recommend start Prednisone 10 mg 6-day Dosepak as directed.  Insurance will not cover Tussionex- will trial Phenergan with codeine cough syrup 1 teaspoon every 8 hours as needed-use mainly at night.  Rest.  Stay at home.  Follow-up pending COVID-19 test results and in 3 to 4 days with her PCP if not improving. Final Clinical Impressions(s) / UC Diagnoses   Final diagnoses:  Acute bronchitis, unspecified organism  Nasal congestion  Wheezing     Discharge  Instructions     Recommend start Prednisone 10mg  tablets- take 6 tablets today and decrease by 1 tablet each day until finished on day 6. May take Tessalon cough pills- 1 every 8 hours as needed. Use Albuterol inhaler 2 puffs every 4 to 6 hours as needed for cough or wheezing. Increase fluids to help loosen up mucus in chest. May take Cough medication with codeine 1 teaspoon every 8 hours as needed- use mainly at night for cough. Rest. Stay at home. Follow-up pending COVID 19 test results and in 3 to 4 days with your PCP if not improving.     ED Prescriptions    Medication Sig Dispense Auth. Provider   predniSONE (STERAPRED UNI-PAK 21 TAB) 10 MG (21) TBPK tablet Take 6 tabs by mouth daily on day 1 then decrease by 1 tablet each day until finished on day 6. 21 tablet Mahathi Pokorney, , NP   albuterol (VENTOLIN HFA) 108 (90 Base) MCG/ACT inhaler Inhale 2 puffs into the lungs every 6 (six) hours as needed for wheezing or shortness of breath. 18 g Ali Lowe, NP   benzonatate (TESSALON) 100 MG capsule Take 1 capsule (100 mg total) by mouth 3 (three) times daily as needed for cough. 21 capsule Sudie Grumbling, NP   promethazine-codeine (PHENERGAN WITH CODEINE) 6.25-10 MG/5ML syrup Take 5 mLs by mouth every 8 (eight) hours as needed for cough. 118 mL 04-[birth year]-1976, NP     PDMP was reviewed this encounter. Last active Rx was for hydrocodone-acetaminophen in April 2021. Previous was Tramadol in 11/2019 and has not filled Adipex-P since May 2020. I feel the benefits outweigh the risks of a controlled cough medication at this time.    June 2020, NP 03/19/20 1711

## 2020-03-19 LAB — SARS CORONAVIRUS 2 (TAT 6-24 HRS): SARS Coronavirus 2: NEGATIVE

## 2020-05-09 ENCOUNTER — Emergency Department: Payer: 59

## 2020-05-09 ENCOUNTER — Emergency Department
Admission: EM | Admit: 2020-05-09 | Discharge: 2020-05-09 | Disposition: A | Discharge status: Home / Self Care | Payer: 59 | Attending: Emergency Medicine | Admitting: Emergency Medicine

## 2020-05-09 ENCOUNTER — Other Ambulatory Visit: Payer: Self-pay

## 2020-05-09 DIAGNOSIS — R0602 Shortness of breath: Secondary | ICD-10-CM | POA: Insufficient documentation

## 2020-05-09 DIAGNOSIS — M545 Low back pain: Secondary | ICD-10-CM | POA: Diagnosis not present

## 2020-05-09 DIAGNOSIS — R0789 Other chest pain: Secondary | ICD-10-CM

## 2020-05-09 DIAGNOSIS — R079 Chest pain, unspecified: Secondary | ICD-10-CM | POA: Diagnosis not present

## 2020-05-09 DIAGNOSIS — F17200 Nicotine dependence, unspecified, uncomplicated: Secondary | ICD-10-CM | POA: Insufficient documentation

## 2020-05-09 DIAGNOSIS — M25511 Pain in right shoulder: Secondary | ICD-10-CM | POA: Insufficient documentation

## 2020-05-09 DIAGNOSIS — R091 Pleurisy: Secondary | ICD-10-CM | POA: Diagnosis present

## 2020-05-09 DIAGNOSIS — M62838 Other muscle spasm: Secondary | ICD-10-CM | POA: Diagnosis not present

## 2020-05-09 DIAGNOSIS — Z7984 Long term (current) use of oral hypoglycemic drugs: Secondary | ICD-10-CM | POA: Insufficient documentation

## 2020-05-09 DIAGNOSIS — Z91040 Latex allergy status: Secondary | ICD-10-CM | POA: Diagnosis not present

## 2020-05-09 LAB — BASIC METABOLIC PANEL
Anion gap: 8 (ref 5–15)
BUN: 11 mg/dL (ref 6–20)
CO2: 26 mmol/L (ref 22–32)
Calcium: 8.6 mg/dL — ABNORMAL LOW (ref 8.9–10.3)
Chloride: 104 mmol/L (ref 98–111)
Creatinine, Ser: 0.8 mg/dL (ref 0.44–1.00)
GFR calc Af Amer: >60 mL/min (ref >60)
GFR calc non Af Amer: >60 mL/min (ref >60)
Glucose, Bld: 90 mg/dL (ref 70–99)
Potassium: 3.6 mmol/L (ref 3.5–5.1)
Sodium: 138 mmol/L (ref 135–145)

## 2020-05-09 LAB — CBC
HCT: 40.2 % (ref 36.0–46.0)
Hemoglobin: 13.6 g/dL (ref 12.0–15.0)
MCH: 29.6 pg (ref 26.0–34.0)
MCHC: 33.8 g/dL (ref 30.0–36.0)
MCV: 87.6 fL (ref 80.0–100.0)
Platelets: 239 10*3/uL (ref 150–400)
RBC: 4.59 MIL/uL (ref 3.87–5.11)
RDW: 12.3 % (ref 11.5–15.5)
WBC: 6.7 10*3/uL (ref 4.0–10.5)
nRBC: 0 % (ref 0.0–0.2)

## 2020-05-09 LAB — TROPONIN I (HIGH SENSITIVITY): Troponin I (High Sensitivity): 3 ng/L (ref <18)

## 2020-05-09 LAB — POC URINE PREG, ED: Preg Test, Ur: NEGATIVE

## 2020-05-09 MED ORDER — BACLOFEN 10 MG PO TABS
10.0000 mg | ORAL_TABLET | Freq: Three times a day (TID) | ORAL | 1 refills | Status: DC
Start: 2020-05-09 — End: 2020-07-06

## 2020-05-09 MED ORDER — SODIUM CHLORIDE 0.9% FLUSH: 3.0000 mL | Freq: Once | INTRAVENOUS | Status: DC

## 2020-05-09 MED ORDER — IOHEXOL 350 MG/ML SOLN
75.0000 mL | Freq: Once | INTRAVENOUS | Status: AC | PRN
  Administered 2020-05-09: 75 mL via INTRAVENOUS
  Filled 2020-05-09: qty 75

## 2020-05-09 MED ORDER — PREDNISONE 10 MG (21) PO TBPK
ORAL_TABLET | ORAL | 0 refills | Status: DC
Start: 2020-05-09 — End: 2020-07-06

## 2020-05-09 NOTE — ED Triage Notes (Signed)
Pt c/o chest pain/tightness with SOB that started this AM, pt is in NAD, respirations WNL, denies any recent cough or congestion.

## 2020-05-09 NOTE — ED Notes (Signed)
See triage note  Presents with pain to chest this am   States pain increases with inspiration  No fever or cough

## 2020-05-09 NOTE — Discharge Instructions (Signed)
Follow-up with your regular doctor if not improving in 2 to 3 days.  Return emergency department worsening.  Take medication as prescribed. 

## 2020-05-09 NOTE — ED Provider Notes (Signed)
[facility] Emergency Department Provider Note  ____________________________________________   First MD Initiated Contact with Patient 05/09/20 1131     (approximate)  I have reviewed the triage vital signs and the nursing notes.   HISTORY  Chief Complaint Pleurisy    HPI Amber Keller is a 34 y.o. female presents emergency department complaint of chest pain, tightness with shortness of breath at rest morning.  Patient was diagnosed with Covid on 10/07/2019.  Patient does smoke and takes birth control pills.  She states her family does have a history of blood clots.  She is also complaining of some right-sided neck pain worse with movement and radiates into the right shoulder.  She denies any fever or chills.  No vomiting or diarrhea.   Past Medical History:  Diagnosis Date   Anxiety    Genital herpes    Obesity, Class III, BMI 40-49.9 (morbid obesity) (HCC)    PCOS (polycystic ovarian syndrome)    Prediabetes     Patient Active Problem List   Diagnosis Date Noted   PCOS (polycystic ovarian syndrome)    Prediabetes    Obesity, Class III, BMI 40-49.9 (morbid obesity) (HCC)     Past Surgical History:  Procedure Laterality Date   KNEE SURGERY Left    NO PAST SURGERIES      Prior to Admission medications   Medication Sig Start Date End Date Taking? Authorizing Provider  acyclovir (ZOVIRAX) 400 MG tablet TAKE 2 TABLETS BY MOUTH THREE TIMES DAILY FOR 2 DAYS 03/04/19   [provider]  albuterol (VENTOLIN HFA) 108 (90 Base) MCG/ACT inhaler Inhale 2 puffs into the lungs every 6 (six) hours as needed for wheezing or shortness of breath. 03/18/20   Sudie Grumbling, NP  baclofen (LIORESAL) 10 MG tablet Take 1 tablet (10 mg total) by mouth 3 (three) times daily. 05/09/20 05/09/21  Sherrie Mustache Roselyn Bering, PA-C  gabapentin (NEURONTIN) 300 MG capsule Take 1 capsule by mouth 3 (three) times daily. 03/04/19   [provider]  JUNEL FE  1/20 1-20 MG-MCG tablet TK 1 T PO QD 04/02/17   [provider]  metFORMIN (GLUCOPHAGE) 500 MG tablet Take by mouth. 03/04/17 01/30/20  [provider]  naproxen (NAPROSYN) 500 MG tablet Take by mouth. 03/04/19   [provider]  phentermine (ADIPEX-P) 37.5 MG tablet TK ONE T PO QAM B BRE 03/04/17   [provider]  predniSONE (STERAPRED UNI-PAK 21 TAB) 10 MG (21) TBPK tablet Take 6 pills on day one then decrease by 1 pill each day 05/09/20   Faythe Ghee, PA-C  fluticasone [facility]) 50 MCG/ACT nasal spray Place 2 sprays into both nostrils daily. 07/26/18 04/15/19  Domenick Gong, MD  traZODone (DESYREL) 50 MG tablet 1-3 tabs po qhs prn insomnia 06/19/18 05/11/19  [provider]    Allergies Latex  Family History  Problem Relation Age of Onset   ALS Paternal Aunt    Lung cancer Paternal Grandfather    Hypertension Mother     Social History Social History   Tobacco Use   Smoking status: Current Every Day Smoker    Packs/day: 1.00   Smokeless tobacco: Never Used  Vaping Use   Vaping Use: Never used  Substance Use Topics   Alcohol use: Yes    Comment: occasional   Drug use: No    Review of Systems  Constitutional: No fever/chills Eyes: No visual changes. ENT: No sore throat. Respiratory: Positive cough, positive shortness of  breath Cardiovascular: Positive chest pain Gastrointestinal: Denies abdominal pain Genitourinary: Negative for dysuria. Musculoskeletal: Negative for back pain. Skin: Negative for rash. Psychiatric: no mood changes,     ____________________________________________   PHYSICAL EXAM:  VITAL SIGNS: ED Triage Vitals  Enc Vitals Group     BP 05/09/20 0956 (!) 144/72     Pulse Rate 05/09/20 0956 (!) 54     Resp 05/09/20 0956 17     Temp 05/09/20 0956 98.1 F (36.7 C)     Temp Source 05/09/20 0956 Oral     SpO2 05/09/20 0956 98 %     Weight 05/09/20 0956 255 lb (115.7 kg)     Height 05/09/20  0956 5\' 2"  (1.575 m)     Head Circumference --      Peak Flow --      Pain Score 05/09/20 1003 7     Pain Loc --      Pain Edu? --      Excl. in GC? --     Constitutional: Alert and oriented. Well appearing and in no acute distress. Eyes: Conjunctivae are normal.  Head: Atraumatic. Nose: No congestion/rhinnorhea. Mouth/Throat: Mucous membranes are moist.   Neck:  supple no lymphadenopathy noted Cardiovascular: Normal rate, regular rhythm. Heart sounds are normal Respiratory: Normal respiratory effort.  No retractions, lungs c t a  GU: deferred Musculoskeletal: FROM all extremities, warm and well perfused, right side of the neck is tender along the musculature and into the right shoulder, full range of motion of the neck, neurovascular intact Neurologic:  Normal speech and language.  Skin:  Skin is warm, dry and intact. No rash noted. Psychiatric: Mood and affect are normal. Speech and behavior are normal.  ____________________________________________   LABS (all labs ordered are listed, but only abnormal results are displayed)  Labs Reviewed  BASIC METABOLIC PANEL - Abnormal; Notable for the following components:      Result Value   Calcium 8.6 (*)    All other components within normal limits  CBC  POC URINE PREG, ED  TROPONIN I (HIGH SENSITIVITY)  TROPONIN I (HIGH SENSITIVITY)   ____________________________________________   ____________________________________________  RADIOLOGY  Chest x-ray is normal CTA for PE is negative for PE  ____________________________________________   PROCEDURES  Procedure(s) performed: No  Procedures    ____________________________________________   INITIAL IMPRESSION / ASSESSMENT AND PLAN / ED COURSE  Pertinent labs & imaging results that were available during my care of the patient were reviewed by me and considered in my medical decision making (see chart for details).   Patient is a 34 year old female presents  emergency department with complaints of chest pain or shortness of breath.  See HPI  Physical exam shows patient to appear well.  She appears to be stable.  Lungs are clear to auscultation.  My exam is unremarkable other than the tenderness in the musculature of the right shoulder.  DDx: CAP, URI, MI, PE, Covid, muscle strain in the right shoulder  CBC is normal, basic metabolic panel is normal, troponin is normal,  Chest x-ray is normal CTA for PE ordered  CTA for PE is negative.  I did explain findings to the patient.  She was given a prescription for steroid pack and muscle relaxer.  She is to return emergency department worsening.  Is given work note.  She discharged stable condition.     Amber Keller was evaluated in Emergency Department on 05/09/2020 for the symptoms described in the history of present illness.  She was evaluated in the context of the global COVID-19 pandemic, which necessitated consideration that the patient might be at risk for infection with the SARS-CoV-2 virus that causes COVID-19. Institutional protocols and algorithms that pertain to the evaluation of patients at risk for COVID-19 are in a state of rapid change based on information released by regulatory bodies including the CDC and federal and state organizations. These policies and algorithms were followed during the patient's care in the ED.    As part of my medical decision making, I reviewed the following data within the electronic MEDICAL RECORD NUMBER Nursing notes reviewed and incorporated, Labs reviewed , Old chart reviewed, Radiograph reviewed , Notes from prior ED visits and [place] Controlled Substance Database  ____________________________________________   FINAL CLINICAL IMPRESSION(S) / ED DIAGNOSES  Final diagnoses:  Chest wall pain  Muscle spasm      NEW MEDICATIONS STARTED DURING THIS VISIT:  Discharge Medication List as of 05/09/2020  1:08 PM    START taking these medications   Details    baclofen (LIORESAL) 10 MG tablet Take 1 tablet (10 mg total) by mouth 3 (three) times daily., Starting Tue 05/09/2020, Until Wed 05/09/2021, Normal    predniSONE (STERAPRED UNI-PAK 21 TAB) 10 MG (21) TBPK tablet Take 6 pills on day one then decrease by 1 pill each day, Normal         Note:  This document was prepared using Dragon voice recognition software and may include unintentional dictation errors.    Faythe Ghee, PA-C 05/09/20 1437    Gilles Chiquito, MD 05/09/20 [phone]

## 2020-07-06 ENCOUNTER — Encounter: Payer: Self-pay | Admitting: Emergency Medicine

## 2020-07-06 ENCOUNTER — Other Ambulatory Visit: Payer: Self-pay

## 2020-07-06 ENCOUNTER — Ambulatory Visit
Admission: EM | Admit: 2020-07-06 | Discharge: 2020-07-06 | Disposition: A | Discharge status: Home / Self Care | Payer: 59 | Attending: Internal Medicine | Admitting: Internal Medicine

## 2020-07-06 DIAGNOSIS — G8929 Other chronic pain: Secondary | ICD-10-CM

## 2020-07-06 DIAGNOSIS — M5440 Lumbago with sciatica, unspecified side: Secondary | ICD-10-CM | POA: Diagnosis not present

## 2020-07-06 MED ORDER — ETODOLAC 500 MG PO TABS
500.0000 mg | ORAL_TABLET | Freq: Two times a day (BID) | ORAL | 0 refills | Status: DC
Start: 2020-07-06 — End: 2020-08-23

## 2020-07-06 MED ORDER — KETOROLAC TROMETHAMINE 60 MG/2ML IM SOLN
60.0000 mg | Freq: Once | INTRAMUSCULAR | Status: AC
  Administered 2020-07-06: 60 mg via INTRAMUSCULAR

## 2020-07-06 NOTE — Discharge Instructions (Signed)
Work on loosing weight which will help with your chronic back pain and other joint pains.

## 2020-07-06 NOTE — ED Provider Notes (Signed)
[facility]    CSN: [id number] Arrival date & time: 07/06/20  1631      History   Chief Complaint Chief Complaint  Patient presents with  . Back Pain    HPI Amber Keller is a 34 y.o. female who presents with recurrence of lower back pain which radiates to her buttocks bilaterally off and on x 1 years. 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 On saturnday she bent over a lot helping her father and working on Par[employer].  She has hx of DDD, but never had an MRI.      Past Medical History:  Diagnosis Date  . Anxiety   . Genital herpes   . Obesity, Class III, BMI 40-49.9 (morbid obesity) (HCC)   . PCOS (polycystic ovarian syndrome)   . Prediabetes     Patient Active Problem List   Diagnosis Date Noted  . PCOS (polycystic ovarian syndrome)   . Prediabetes   . Obesity, Class III, BMI 40-49.9 (morbid obesity) (HCC)     Past Surgical History:  Procedure Laterality Date  . KNEE SURGERY Left   . NO PAST SURGERIES      OB History    Gravida  0   Para  0   Term  0   Preterm  0   AB  0   Living  0     SAB  0   TAB  0   Ectopic  0   Multiple  0   Live Births  0            Home Medications    Prior to Admission medications   Medication Sig Start Date End Date Taking? Authorizing Provider  acyclovir (ZOVIRAX) 400 MG tablet TAKE 2 TABLETS BY MOUTH THREE TIMES DAILY FOR 2 DAYS 03/04/19  Yes [provider]  gabapentin (NEURONTIN) 300 MG capsule Take 1 capsule by mouth 3 (three) times daily. 03/04/19  Yes [provider]  JUNEL FE 1/20 1-20 MG-MCG tablet TK 1 T PO QD 04/02/17  Yes [provider]  naproxen (NAPROSYN) 500 MG tablet Take by mouth. 03/04/19  Yes  [provider]  albuterol (VENTOLIN HFA) 108 (90 Base) MCG/ACT inhaler Inhale 2 puffs into the lungs every 6 (six) hours as needed for wheezing or shortness of breath. 03/18/20   Sudie Grumbling, NP  baclofen (LIORESAL) 10 MG tablet Take 1 tablet (10 mg total) by mouth 3 (three) times daily. 05/09/20 05/09/21  Sherrie Mustache Roselyn Bering, PA-C  metFORMIN (GLUCOPHAGE) 500 MG tablet Take by mouth. 03/04/17 01/30/20  [provider]  phentermine (ADIPEX-P) 37.5 MG tablet TK ONE T PO QAM B BRE 03/04/17   [provider]  predniSONE (STERAPRED UNI-PAK 21 TAB) 10 MG (21) TBPK tablet Take 6 pills on day one then decrease by 1 pill each day 05/09/20   Faythe Ghee, PA-C  fluticasone [facility] 50 MCG/ACT nasal spray Place 2 sprays into both nostrils daily. 07/26/18 04/15/19  Domenick Gong, MD  traZODone (DESYREL) 50 MG tablet 1-3 tabs po qhs prn insomnia 06/19/18 05/11/19  [provider]    Family History Family History  Problem Relation Age of Onset  .  ALS Paternal Aunt   . Lung cancer Paternal Grandfather   . Hypertension Mother     Social History Social History   Tobacco Use  . Smoking status: Current Every Day Smoker    Packs/day: 1.00  . Smokeless tobacco: Never Used  Vaping Use  . Vaping Use: Never used  Substance Use Topics  . Alcohol use: Yes    Comment: occasional  . Drug use: No     Allergies   Latex   Review of Systems Review of Systems  Gastrointestinal: Negative for abdominal pain.  Genitourinary: Negative for difficulty urinating.  Musculoskeletal: Positive for arthralgias and back pain.       Has chronic L knee pain and back pain  Skin: Negative for rash and wound.  Neurological: Negative for weakness and numbness.     Physical Exam Triage Vital Signs ED Triage Vitals  Enc Vitals Group     BP 07/06/20 1656 137/90     Pulse Rate 07/06/20 1656 72     Resp 07/06/20 1656 18     Temp 07/06/20 1656 98.2 F (36.8 C)     Temp Source 07/06/20  1656 Oral     SpO2 07/06/20 1656 98 %     Weight 07/06/20 1652 255 lb 1.2 oz (115.7 kg)     Height 07/06/20 1652 5\' 2"  (1.575 m)     Head Circumference --      Peak Flow --      Pain Score 07/06/20 1652 7     Pain Loc --      Pain Edu? --      Excl. in GC? --    No data found.  Updated Vital Signs BP 137/90 (BP Location: Left Arm)   Pulse 72   Temp 98.2 F (36.8 C) (Oral)   Resp 18   Ht 5\' 2"  (1.575 m)   Wt 255 lb 1.2 oz (115.7 kg)   LMP 06/15/2020 (Approximate)   SpO2 98%   BMI 46.65 kg/m   Visual Acuity Right Eye Distance:   Left Eye Distance:   Bilateral Distance:    Right Eye Near:   Left Eye Near:    Bilateral Near:     Physical Exam Vitals and nursing note reviewed.  Constitutional:      Appearance: She is obese. She is ill-appearing and diaphoretic.  HENT:     Right Ear: External ear normal.     Left Ear: External ear normal.  Eyes:     General: No scleral icterus.    Conjunctiva/sclera: Conjunctivae normal.  Pulmonary:     Effort: Pulmonary effort is normal.  Musculoskeletal:     Cervical back: Neck supple.     Comments: BACK- has local tenderness on her R piriformis, and L lower lumbar region with palpation. Anterior flexion is to 90 degrees at which point she feels pain on her sacral region. Raising back up from this position is difficult due to stiffness and pain. Pain provoked on the L with lateral flexion and on the R with R lateral flexion. Neg SLR.   Skin:    General: Skin is warm.  Neurological:     Mental Status: She is alert and oriented to person, place, and time.     Gait: Gait normal.     Deep Tendon Reflexes: Reflexes normal.  Psychiatric:        Mood and Affect: Mood normal.        Behavior: Behavior normal.  Thought Content: Thought content normal.        Judgment: Judgment normal.    UC Treatments / Results  Labs (all labs ordered are listed, but only abnormal results are displayed) Labs Reviewed - No data to  display  EKG   Radiology No results found.  Procedures Procedures (including critical care time)  Medications Ordered in UC Medications - No data to display  Initial Impression / Assessment and Plan / UC Course  I have reviewed the triage vital signs and the nursing notes. Chronic back pain with flair.  She was given Todadol 60 mg IM which she has had before and helped her. I prescribed her Lodine 600 mg bid prn pain. Work note given with restrictions.  Advised to see spine ortho for further work up.  Pertinent labs & imaging results that were available during my care of the patient were reviewed by me and considered in my medical decision making (see chart for details).  Final Clinical Impressions(s) / UC Diagnoses   Final diagnoses:  None   Discharge Instructions   None    ED Prescriptions    None     PDMP not reviewed this encounter.   Garey Ham, [place] 07/06/20 1749

## 2020-07-06 NOTE — ED Triage Notes (Signed)
Pt c/o lower back pain that radiates down bilateral legs. She states her job requires her to bend over and wants a doctors note.

## 2020-08-23 ENCOUNTER — Other Ambulatory Visit: Payer: Self-pay

## 2020-08-23 ENCOUNTER — Ambulatory Visit
Admission: EM | Admit: 2020-08-23 | Discharge: 2020-08-23 | Disposition: A | Discharge status: Home / Self Care | Payer: Commercial Managed Care - PPO | Attending: Family Medicine | Admitting: Family Medicine

## 2020-08-23 ENCOUNTER — Encounter: Payer: Self-pay | Admitting: Emergency Medicine

## 2020-08-23 DIAGNOSIS — N760 Acute vaginitis: Secondary | ICD-10-CM | POA: Insufficient documentation

## 2020-08-23 DIAGNOSIS — B9689 Other specified bacterial agents as the cause of diseases classified elsewhere: Secondary | ICD-10-CM

## 2020-08-23 LAB — URINALYSIS, COMPLETE (UACMP) WITH MICROSCOPIC
Bilirubin Urine: NEGATIVE
Glucose, UA: NEGATIVE mg/dL
Hgb urine dipstick: NEGATIVE
Ketones, ur: NEGATIVE mg/dL
Leukocytes,Ua: NEGATIVE
Nitrite: NEGATIVE
Protein, ur: NEGATIVE mg/dL
Specific Gravity, Urine: 1.025 (ref 1.005–1.030)
pH: 5 (ref 5.0–8.0)

## 2020-08-23 LAB — WET PREP, GENITAL
Sperm: NONE SEEN
Trich, Wet Prep: NONE SEEN
WBC, Wet Prep HPF POC: NONE SEEN
Yeast Wet Prep HPF POC: NONE SEEN

## 2020-08-23 LAB — PREGNANCY, URINE: Preg Test, Ur: NEGATIVE

## 2020-08-23 MED ORDER — METRONIDAZOLE 500 MG PO TABS
500.0000 mg | ORAL_TABLET | Freq: Two times a day (BID) | ORAL | 0 refills | Status: DC
Start: 2020-08-23 — End: 2021-01-01

## 2020-08-23 NOTE — Discharge Instructions (Addendum)
Try Aveeno soap.  Shower after intercourse.  Take care  Dr. Adriana Simas

## 2020-08-23 NOTE — ED Provider Notes (Signed)
[facility]    CSN: [id number] Arrival date & time: 08/23/20  1815      History   Chief Complaint Chief Complaint  Patient presents with  . Vaginal Discharge   HPI  34 year old female presents with vaginal discharge and associated itching.  3-day history of vaginal itching.  Slight discharge.  Started after she had intercourse with her significant other.  She has been using Monistat and hydrocortisone cream without relief.  Denies concerns for STDs.  She states that she is also had some urinary frequency.  No other associated symptoms.  No other complaints.  Past Medical History:  Diagnosis Date  . Anxiety   . Genital herpes   . Obesity, Class III, BMI 40-49.9 (morbid obesity) (HCC)   . PCOS (polycystic ovarian syndrome)   . Prediabetes     Patient Active Problem List   Diagnosis Date Noted  . PCOS (polycystic ovarian syndrome)   . Prediabetes   . Obesity, Class III, BMI 40-49.9 (morbid obesity) (HCC)     Past Surgical History:  Procedure Laterality Date  . KNEE SURGERY Left     OB History    Gravida  0   Para  0   Term  0   Preterm  0   AB  0   Living  0     SAB  0   TAB  0   Ectopic  0   Multiple  0   Live Births  0            Home Medications    Prior to Admission medications   Medication Sig Start Date End Date Taking? Authorizing Provider  acyclovir (ZOVIRAX) 400 MG tablet TAKE 2 TABLETS BY MOUTH THREE TIMES DAILY FOR 2 DAYS 03/04/19  Yes [provider]  gabapentin (NEURONTIN) 300 MG capsule Take 1 capsule by mouth 3 (three) times daily. 03/04/19  Yes [provider]  JUNEL FE 1/20 1-20 MG-MCG tablet TK 1 T PO QD 04/02/17  Yes [provider]  metFORMIN (GLUCOPHAGE) 500 MG tablet Take by mouth. 03/04/17 08/23/20 Yes [provider]  naproxen (NAPROSYN) 500 MG tablet Take by mouth. 03/04/19  Yes [provider]  metroNIDAZOLE (FLAGYL) 500 MG tablet Take 1 tablet (500 mg total) by  mouth 2 (two) times daily. 08/23/20   Tommie Sams, DO  albuterol (VENTOLIN HFA) 108 (90 Base) MCG/ACT inhaler Inhale 2 puffs into the lungs every 6 (six) hours as needed for wheezing or shortness of breath. 03/18/20 08/23/20  Sudie Grumbling, NP  fluticasone (FLONASE) 50 MCG/ACT nasal spray Place 2 sprays into both nostrils daily. 07/26/18 04/15/19  Domenick Gong, MD  phentermine (ADIPEX-P) 37.5 MG tablet TK ONE T PO QAM B BRE 03/04/17 07/06/20  [provider]  traZODone (DESYREL) 50 MG tablet 1-3 tabs po qhs prn insomnia 06/19/18 05/11/19  [provider]    Family History Family History  Problem Relation Age of Onset  . ALS Paternal Aunt   . Lung cancer Paternal Grandfather   . Hypertension Mother   . Breast cancer Father     Social History Social History   Tobacco Use  . Smoking status: Current Every Day Smoker    Packs/day: 0.50    Years: 10.00    Pack years: 5.00    Types: Cigarettes  . Smokeless tobacco: Never Used  Vaping Use  . Vaping Use: Never used  Substance Use Topics  . Alcohol use: Yes    Comment:  occasional  . Drug use: No     Allergies   Latex   Review of Systems Review of Systems  Genitourinary: Positive for frequency and vaginal discharge.       Vaginal itching.   Physical Exam Triage Vital Signs ED Triage Vitals  Enc Vitals Group     BP 08/23/20 1827 127/84     Pulse Rate 08/23/20 1827 77     Resp 08/23/20 1827 20     Temp 08/23/20 1827 98.8 F (37.1 C)     Temp Source 08/23/20 1827 Oral     SpO2 08/23/20 1827 98 %     Weight 08/23/20 1826 250 lb (113.4 kg)     Height 08/23/20 1826 5\' 2"  (1.575 m)     Head Circumference --      Peak Flow --      Pain Score 08/23/20 1826 0     Pain Loc --      Pain Edu? --      Excl. in GC? --    Updated Vital Signs BP 127/84 (BP Location: Left Arm)   Pulse 77   Temp 98.8 F (37.1 C) (Oral)   Resp 20   Ht 5\' 2"  (1.575 m)   Wt 113.4 kg   LMP 06/19/2020 (Approximate)   SpO2 98%    BMI 45.73 kg/m   Visual Acuity Right Eye Distance:   Left Eye Distance:   Bilateral Distance:    Right Eye Near:   Left Eye Near:    Bilateral Near:     Physical Exam Vitals and nursing note reviewed.  Constitutional:      General: She is not in acute distress.    Appearance: Normal appearance. She is obese. She is not ill-appearing.  HENT:     Head: Normocephalic and atraumatic.  Eyes:     General:        Right eye: No discharge.        Left eye: No discharge.     Conjunctiva/sclera: Conjunctivae normal.  Pulmonary:     Effort: Pulmonary effort is normal. No respiratory distress.  Neurological:     Mental Status: She is alert.  Psychiatric:        Mood and Affect: Mood normal.        Behavior: Behavior normal.    UC Treatments / Results  Labs (all labs ordered are listed, but only abnormal results are displayed) Labs Reviewed  WET PREP, GENITAL - Abnormal; Notable for the following components:      Result Value   Clue Cells Wet Prep HPF POC PRESENT (*)    All other components within normal limits  URINALYSIS, COMPLETE (UACMP) WITH MICROSCOPIC - Abnormal; Notable for the following components:   APPearance HAZY (*)    Bacteria, UA FEW (*)    All other components within normal limits  URINE CULTURE  PREGNANCY, URINE    EKG   Radiology No results found.  Procedures Procedures (including critical care time)  Medications Ordered in UC Medications - No data to display  Initial Impression / Assessment and Plan / UC Course  I have reviewed the triage vital signs and the nursing notes.  Pertinent labs & imaging results that were available during my care of the patient were reviewed by me and considered in my medical decision making (see chart for details).    34 year old female presents with bacterial vaginosis.  Treating with Flagyl.  Final Clinical Impressions(s) / UC Diagnoses   Final diagnoses:  Bacterial vaginosis     Discharge Instructions       Try Aveeno soap.  Shower after intercourse.  Take care  Dr. Adriana Simas    ED Prescriptions    Medication Sig Dispense Auth. Provider   metroNIDAZOLE (FLAGYL) 500 MG tablet Take 1 tablet (500 mg total) by mouth 2 (two) times daily. 14 tablet Tommie Sams, DO     PDMP not reviewed this encounter.   Tommie Sams, [place] 08/23/20 1940

## 2020-08-23 NOTE — ED Triage Notes (Addendum)
Patient in today c/o vaginal itching x 3 days. Patient has been using OTC Monistat 7 and hydrocortisone cream without relief. Patient denies any concern for STDs.

## 2020-08-23 NOTE — ED Triage Notes (Signed)
Patient states she is also having urinary frequency and pain if she doesn't go to the bathroom while she is sleeping.

## 2020-08-25 LAB — URINE CULTURE

## 2020-10-26 ENCOUNTER — Other Ambulatory Visit: Payer: Self-pay

## 2020-10-26 ENCOUNTER — Encounter: Payer: Self-pay | Admitting: Emergency Medicine

## 2020-10-26 ENCOUNTER — Ambulatory Visit
Admission: EM | Admit: 2020-10-26 | Discharge: 2020-10-26 | Disposition: A | Discharge status: Home / Self Care | Payer: Commercial Managed Care - PPO | Attending: Sports Medicine | Admitting: Sports Medicine

## 2020-10-26 DIAGNOSIS — M25531 Pain in right wrist: Secondary | ICD-10-CM | POA: Diagnosis not present

## 2020-10-26 DIAGNOSIS — M778 Other enthesopathies, not elsewhere classified: Secondary | ICD-10-CM

## 2020-10-26 NOTE — ED Triage Notes (Signed)
Patient c/o right wrist pain that started 2 days ago.  Patient denies injury or fall.  Patient denies any numbness or tingling in her hand or fingers.

## 2020-10-26 NOTE — ED Provider Notes (Signed)
[facility]    CSN: [id number] Arrival date & time: 10/26/20  1027      History   Chief Complaint Chief Complaint  Patient presents with  . Wrist Pain    right    HPI Amber Keller is a 35 y.o. female.   Patient pleasant 35 year old right-hand-dominant female who presents for evaluation of the above issue.  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  She said that she has had right wrist pain for several days now.  No accidents trauma or falls.  No swelling bruising or redness.  No numbness or tingling.  No involvement of her elbow shoulder or neck.  She did fall back in 2020 and sprained his right wrist.  No red flag signs or symptoms offered on history.     Past Medical History:  Diagnosis Date  . Anxiety   . Genital herpes   . Obesity, Class III, BMI 40-49.9 (morbid obesity) (HCC)   . PCOS (polycystic ovarian syndrome)   . Prediabetes     Patient Active Problem List   Diagnosis Date Noted  . PCOS (polycystic ovarian syndrome)   . Prediabetes   . Obesity, Class III, BMI 40-49.9 (morbid obesity) (HCC)     Past Surgical History:  Procedure Laterality Date  . KNEE SURGERY Left     OB History    Gravida  0   Para  0   Term  0   Preterm  0   AB  0   Living  0     SAB  0   IAB  0   Ectopic  0   Multiple  0   Live Births  0            Home Medications    Prior to Admission medications   Medication Sig Start Date End Date Taking? Authorizing Provider  gabapentin (NEURONTIN) 300 MG capsule Take 1 capsule by mouth 3 (three) times daily. 03/04/19  Yes [provider]  JUNEL FE 1/20 1-20 MG-MCG tablet TK 1 T PO QD 04/02/17  Yes [provider]  metFORMIN (GLUCOPHAGE) 500 MG tablet Take by mouth. 03/04/17 10/26/20 Yes [provider]  acyclovir (ZOVIRAX) 400 MG tablet TAKE 2 TABLETS BY MOUTH THREE TIMES DAILY FOR 2 DAYS 03/04/19   [provider]  metroNIDAZOLE  (FLAGYL) 500 MG tablet Take 1 tablet (500 mg total) by mouth 2 (two) times daily. 08/23/20   Tommie Sams, DO  naproxen (NAPROSYN) 500 MG tablet Take by mouth. 03/04/19   [provider]  albuterol (VENTOLIN HFA) 108 (90 Base) MCG/ACT inhaler Inhale 2 puffs into the lungs every 6 (six) hours as needed for wheezing or shortness of breath. 03/18/20 08/23/20  Sudie Grumbling, NP  fluticasone (FLONASE) 50 MCG/ACT nasal spray Place 2 sprays into both nostrils daily. 07/26/18 04/15/19  Domenick Gong, MD  phentermine (ADIPEX-P) 37.5 MG tablet TK ONE T PO QAM B BRE 03/04/17 07/06/20  [provider]  traZODone (DESYREL) 50 MG tablet 1-3 tabs po qhs prn insomnia 06/19/18 05/11/19  [provider]    Family History Family History  Problem Relation Age of Onset  . ALS Paternal Aunt   . Lung cancer Paternal Grandfather   . Hypertension Mother   . Breast cancer Father     Social History Social History   Tobacco Use  . Smoking status: Current Every Day Smoker    Packs/day: 0.50  Years: 10.00    Pack years: 5.00    Types: Cigarettes  . Smokeless tobacco: Never Used  Vaping Use  . Vaping Use: Never used  Substance Use Topics  . Alcohol use: Yes    Comment: occasional  . Drug use: No     Allergies   Latex   Review of Systems Review of Systems  Constitutional: Negative.   HENT: Negative.   Eyes: Negative.   Respiratory: Negative.   Cardiovascular: Negative.   Gastrointestinal: Negative.   Endocrine: Negative.   Genitourinary: Negative.   Musculoskeletal: Positive for arthralgias. Negative for joint swelling.  Skin: Negative for color change, pallor, rash and wound.  All other systems reviewed and are negative.    Physical Exam Triage Vital Signs ED Triage Vitals  Enc Vitals Group     BP 10/26/20 1056 119/71     Pulse Rate 10/26/20 1056 86     Resp 10/26/20 1056 14     Temp 10/26/20 1056 98.7 F (37.1 C)     Temp Source 10/26/20 1056 Oral      SpO2 10/26/20 1056 98 %     Weight 10/26/20 1053 253 lb (114.8 kg)     Height 10/26/20 1053 5\' 3"  (1.6 m)     Head Circumference --      Peak Flow --      Pain Score 10/26/20 1053 6     Pain Loc --      Pain Edu? --      Excl. in GC? --    No data found.  Updated Vital Signs BP 119/71 (BP Location: Left Arm)   Pulse 86   Temp 98.7 F (37.1 C) (Oral)   Resp 14   Ht 5\' 3"  (1.6 m)   Wt 114.8 kg   LMP 09/26/2020 (Approximate)   SpO2 98%   BMI 44.82 kg/m   Visual Acuity Right Eye Distance:   Left Eye Distance:   Bilateral Distance:    Right Eye Near:   Left Eye Near:    Bilateral Near:     Physical Exam Vitals and nursing note reviewed.  Musculoskeletal:     Right wrist: Tenderness present. No swelling, deformity, effusion, bony tenderness, snuff box tenderness or crepitus. Normal range of motion.     Left wrist: Normal.     Comments: Patient complains of discomfort with palpation over the entire wrist.  No evidence of any TFCC injury.  Phalens test is negative.  No evidence of carpal tunnel syndrome.  Her strength is well-preserved.  She does complain of discomfort with terminal wrist extension, wrist flexion, ulnar and radial deviation.  Normal sensation 2+ pulses distally  Skin:    General: Skin is warm and dry.     Capillary Refill: Capillary refill takes less than 2 seconds.  Neurological:     General: No focal deficit present.     Mental Status: She is oriented to person, place, and time.      UC Treatments / Results  Labs (all labs ordered are listed, but only abnormal results are displayed) Labs Reviewed - No data to display  EKG   Radiology No results found.  Procedures Procedures (including critical care time)  Medications Ordered in UC Medications - No data to display  Initial Impression / Assessment and Plan / UC Course  I have reviewed the triage vital signs and the nursing notes.  Pertinent labs & imaging results that were available  during my care of the patient were  reviewed by me and considered in my medical decision making (see chart for details).   Clinical impression: Right wrist pain in a right-hand-dominant female.  No trauma.  Seems consistent with wrist tendinitis.  No evidence of carpal tunnel syndrome.  No instability.  Treatment plan: 1.  The findings and treatment plan were discussed in detail with the patient.  Patient was in agreement. 2.  I recommended supportive care. 3.  She can purchase an over-the-counter wrist brace.  This should be a boomerang type of brace that allows movement without any hard or restrictive components. 4.  Over-the-counter meds as needed, Tylenol or Motrin for discomfort. 5.  Gave her a work note saying that she was seen today and that the company doctor should evaluate her for potential light duties. 6.  She may benefit from physical therapy but I will leave it to the company doctor to see if that is something they want to pursue. 7.  Educational handout was provided. 8.  Follow-up here as needed.    Final Clinical Impressions(s) / UC Diagnoses   Final diagnoses:  Acute pain of right wrist  Right wrist tendinitis     Discharge Instructions     I recommended supportive care. She can purchase an over-the-counter wrist brace.  This should be a boomerang type of brace that allows movement without any hard or restrictive components. Over-the-counter meds as needed, Tylenol or Motrin for discomfort. Gave her a work note saying that she was seen today and that the company doctor should evaluate her for potential light duties. She may benefit from physical therapy but I will leave it to the company doctor to see if that is something they want to pursue. Educational handout was provided. Follow-up here as needed.    ED Prescriptions    None     PDMP not reviewed this encounter.   Delton See, MD 10/26/20 1155

## 2020-10-26 NOTE — Discharge Instructions (Addendum)
I recommended supportive care. She can purchase an over-the-counter wrist brace.  This should be a boomerang type of brace that allows movement without any hard or restrictive components. Over-the-counter meds as needed, Tylenol or Motrin for discomfort. Gave her a work note saying that she was seen today and that the company doctor should evaluate her for potential light duties. She may benefit from physical therapy but I will leave it to the company doctor to see if that is something they want to pursue. Educational handout was provided. Follow-up here as needed.

## 2021-01-01 ENCOUNTER — Other Ambulatory Visit: Payer: Self-pay

## 2021-01-01 ENCOUNTER — Ambulatory Visit
Admission: EM | Admit: 2021-01-01 | Discharge: 2021-01-01 | Disposition: A | Discharge status: Home / Self Care | Payer: Commercial Managed Care - PPO | Attending: Physician Assistant | Admitting: Physician Assistant

## 2021-01-01 ENCOUNTER — Encounter: Payer: Self-pay | Admitting: Emergency Medicine

## 2021-01-01 DIAGNOSIS — J302 Other seasonal allergic rhinitis: Secondary | ICD-10-CM | POA: Insufficient documentation

## 2021-01-01 DIAGNOSIS — N9089 Other specified noninflammatory disorders of vulva and perineum: Secondary | ICD-10-CM | POA: Diagnosis present

## 2021-01-01 DIAGNOSIS — B9689 Other specified bacterial agents as the cause of diseases classified elsewhere: Secondary | ICD-10-CM | POA: Diagnosis present

## 2021-01-01 DIAGNOSIS — N760 Acute vaginitis: Secondary | ICD-10-CM | POA: Diagnosis present

## 2021-01-01 LAB — URINALYSIS, COMPLETE (UACMP) WITH MICROSCOPIC
Bilirubin Urine: NEGATIVE
Glucose, UA: NEGATIVE mg/dL
Hgb urine dipstick: NEGATIVE
Ketones, ur: NEGATIVE mg/dL
Leukocytes,Ua: NEGATIVE
Nitrite: NEGATIVE
Protein, ur: NEGATIVE mg/dL
RBC / HPF: NONE SEEN RBC/hpf (ref 0–5)
Specific Gravity, Urine: >1.03 — ABNORMAL HIGH (ref 1.005–1.030)
pH: 6.5 (ref 5.0–8.0)

## 2021-01-01 LAB — WET PREP, GENITAL
Sperm: NONE SEEN
Trich, Wet Prep: NONE SEEN
WBC, Wet Prep HPF POC: NONE SEEN
Yeast Wet Prep HPF POC: NONE SEEN

## 2021-01-01 MED ORDER — FEXOFENADINE HCL 180 MG PO TABS: 180.0000 mg | ORAL_TABLET | Freq: Every day | ORAL | 0 refills | Status: DC

## 2021-01-01 MED ORDER — METRONIDAZOLE 500 MG PO TABS
500.0000 mg | ORAL_TABLET | Freq: Two times a day (BID) | ORAL | 0 refills | Status: DC
Start: 2021-01-01 — End: 2021-03-14

## 2021-01-01 NOTE — ED Triage Notes (Signed)
Patient c/o vaginal itching, discharge, and odor that started 1-2 days ago. She states she also has noticed a bump on the vaginal area. She states she has no concerns for STDs. She does report urinary frequency.

## 2021-01-01 NOTE — ED Provider Notes (Signed)
[facility]    CSN: [id number] Arrival date & time: 01/01/21  1134      History   Chief Complaint Chief Complaint  Patient presents with  . Vaginal Discharge  . Vaginal Itching  . Urinary Frequency    HPI Amber Keller is a 35 y.o. female who presents with 2 problem 1- vaginal itching and discharge with odor x 2 days and also noticed a bump on R inner labia majora which is not painful.  2- has a tickle on her throat that makes her cough, and admits she smokes and in the past few days been sneezing. No fever, chills, sweats or hemoptysis. Sometimes she hears herself wheeze.     Past Medical History:  Diagnosis Date  . Anxiety   . Genital herpes   . Obesity, Class III, BMI 40-49.9 (morbid obesity) (HCC)   . PCOS (polycystic ovarian syndrome)   . Prediabetes     Patient Active Problem List   Diagnosis Date Noted  . PCOS (polycystic ovarian syndrome)   . Prediabetes   . Obesity, Class III, BMI 40-49.9 (morbid obesity) (HCC)     Past Surgical History:  Procedure Laterality Date  . KNEE SURGERY Left     OB History    Gravida  0   Para  0   Term  0   Preterm  0   AB  0   Living  0     SAB  0   IAB  0   Ectopic  0   Multiple  0   Live Births  0            Home Medications    Prior to Admission medications   Medication Sig Start Date End Date Taking? Authorizing Provider  acyclovir (ZOVIRAX) 400 MG tablet TAKE 2 TABLETS BY MOUTH THREE TIMES DAILY FOR 2 DAYS 03/04/19  Yes [provider]  fexofenadine (ALLEGRA ALLERGY) 180 MG tablet Take 1 tablet (180 mg total) by mouth daily. 01/01/21  Yes Rodriguez-Southworth, Nettie Elm, PA-C  gabapentin (NEURONTIN) 300 MG capsule Take 1 capsule by mouth 3 (three) times daily. 03/04/19  Yes [provider]  JUNEL FE 1/20 1-20 MG-MCG tablet TK 1 T PO QD 04/02/17  Yes [provider]  meloxicam (MOBIC) 15 MG tablet Take 15 mg by mouth daily. 11/24/20  Yes [provider]  naproxen (NAPROSYN) 500 MG tablet Take by mouth. 03/04/19  Yes [provider]  tiZANidine (ZANAFLEX) 2 MG tablet Take by mouth. 11/29/20  Yes [provider]  traMADol (ULTRAM) 50 MG tablet Take by mouth. 12/13/20  Yes [provider]  metFORMIN (GLUCOPHAGE) 500 MG tablet Take by mouth. 03/04/17 10/26/20  [provider]  metroNIDAZOLE (FLAGYL) 500 MG tablet Take 1 tablet (500 mg total) by mouth 2 (two) times daily. 01/01/21   Rodriguez-Southworth, Nettie Elm, PA-C  albuterol (VENTOLIN HFA) 108 (90 Base) MCG/ACT inhaler Inhale 2 puffs into the lungs every 6 (six) hours as needed for wheezing or shortness of breath. 03/18/20 08/23/20  Sudie Grumbling, NP  fluticasone (FLONASE) 50 MCG/ACT nasal spray Place 2 sprays into both nostrils daily. 07/26/18 04/15/19  Domenick Gong, MD  phentermine (ADIPEX-P) 37.5 MG tablet TK ONE T PO QAM B BRE 03/04/17 07/06/20  [provider]  traZODone (DESYREL) 50 MG tablet 1-3 tabs po qhs prn insomnia 06/19/18 05/11/19  [provider]    Family History Family History  Problem Relation Age of Onset  . ALS Paternal  Aunt   . Lung cancer Paternal Grandfather   . Hypertension Mother   . Breast cancer Father     Social History Social History   Tobacco Use  . Smoking status: Current Every Day Smoker    Packs/day: 0.50    Years: 10.00    Pack years: 5.00    Types: Cigarettes  . Smokeless tobacco: Never Used  Vaping Use  . Vaping Use: Never used  Substance Use Topics  . Alcohol use: Yes    Comment: occasional  . Drug use: No     Allergies   Latex   Review of Systems Review of Systems  Genitourinary: Negative for dysuria.   + vaginal odor, lump. Cough, postnasal drainage. The rest of 10 point ROS is neg.   Physical Exam Triage Vital Signs ED Triage Vitals  Enc Vitals Group     BP 01/01/21 1229 (!) 137/99     Pulse Rate 01/01/21 1229 69     Resp 01/01/21 1229 18     Temp 01/01/21 1229  98.4 F (36.9 C)     Temp Source 01/01/21 1229 Oral     SpO2 01/01/21 1229 99 %     Weight 01/01/21 1227 253 lb 1.4 oz (114.8 kg)     Height 01/01/21 1227 5\' 3"  (1.6 m)     Head Circumference --      Peak Flow --      Pain Score 01/01/21 1227 0     Pain Loc --      Pain Edu? --      Excl. in GC? --    No data found.  Updated Vital Signs BP (!) 137/99 (BP Location: Right Arm)   Pulse 69   Temp 98.4 F (36.9 C) (Oral)   Resp 18   Ht 5\' 3"  (1.6 m)   Wt 253 lb 1.4 oz (114.8 kg)   SpO2 99%   BMI 44.83 kg/m   Visual Acuity Right Eye Distance:   Left Eye Distance:   Bilateral Distance:    Right Eye Near:   Left Eye Near:    Bilateral Near:     Physical Exam Constitutional:      General: She is not in acute distress.    Appearance: She is obese. She is not toxic-appearing.  HENT:     Right Ear: Tympanic membrane, ear canal and external ear normal.     Left Ear: Tympanic membrane, ear canal and external ear normal.     Nose: Nose normal.     Mouth/Throat:     Mouth: Mucous membranes are moist.     Pharynx: Oropharynx is clear.  Eyes:     General: No scleral icterus.    Conjunctiva/sclera: Conjunctivae normal.  Cardiovascular:     Rate and Rhythm: Normal rate and regular rhythm.     Heart sounds: No murmur heard.   Pulmonary:     Effort: Pulmonary effort is normal.     Breath sounds: Normal breath sounds.  Genitourinary:    Comments: LABIA- R majora with a 28mm x 60mm skin color raised lesion which looks like a superficial sebaceous cyst  Musculoskeletal:        General: Normal range of motion.     Cervical back: Neck supple.  Lymphadenopathy:     Cervical: No cervical adenopathy.  Skin:    General: Skin is warm and dry.     Findings: No rash.  Neurological:     Mental Status: She is alert  and oriented to person, place, and time.     Gait: Gait normal.  Psychiatric:        Mood and Affect: Mood normal.        Behavior: Behavior normal.        Thought  Content: Thought content normal.        Judgment: Judgment normal.      UC Treatments / Results  Labs (all labs ordered are listed, but only abnormal results are displayed) Labs Reviewed  WET PREP, GENITAL - Abnormal; Notable for the following components:      Result Value   Clue Cells Wet Prep HPF POC PRESENT (*)    All other components within normal limits  URINALYSIS, COMPLETE (UACMP) WITH MICROSCOPIC - Abnormal; Notable for the following components:   APPearance HAZY (*)    Specific Gravity, Urine >1.030 (*)    Bacteria, UA RARE (*)    All other components within normal limits    EKG   Radiology No results found.  Procedures Procedures (including critical care time)  Medications Ordered in UC Medications - No data to display  Initial Impression / Assessment and Plan / UC Course  I have reviewed the triage vital signs and the nursing notes. Pertinent labs  results that were available during my care of the patient were reviewed by me and considered in my medical decision making (see chart for details). Has BV and possibly allergies causing the cough. She declined Albuterol inhaler and is willing to try Allergy med, so I sent allegra. I also sent Flagyl as noted. Reassured the labia lesion is not suspicious for STD. May Fu with GYN if she is concerned.    Final Clinical Impressions(s) / UC Diagnoses   Final diagnoses:  Bacterial vaginosis  Labial lesion  Seasonal allergies   Discharge Instructions   None    ED Prescriptions    Medication Sig Dispense Auth. Provider   metroNIDAZOLE (FLAGYL) 500 MG tablet Take 1 tablet (500 mg total) by mouth 2 (two) times daily. 14 tablet Rodriguez-Southworth, Nettie Elm, PA-C   fexofenadine (ALLEGRA ALLERGY) 180 MG tablet Take 1 tablet (180 mg total) by mouth daily. 30 tablet Rodriguez-Southworth, Nettie Elm, PA-C     PDMP not reviewed this encounter.   Garey Ham, Cordelia Poche 01/01/21 2118

## 2021-01-31 ENCOUNTER — Other Ambulatory Visit: Payer: Self-pay | Admitting: Orthopedic Surgery

## 2021-01-31 DIAGNOSIS — M25531 Pain in right wrist: Secondary | ICD-10-CM

## 2021-01-31 DIAGNOSIS — M654 Radial styloid tenosynovitis [de Quervain]: Secondary | ICD-10-CM

## 2021-01-31 DIAGNOSIS — M778 Other enthesopathies, not elsewhere classified: Secondary | ICD-10-CM

## 2021-02-13 ENCOUNTER — Ambulatory Visit
Admission: RE | Admit: 2021-02-13 | Discharge: 2021-02-13 | Disposition: A | Discharge status: Home / Self Care | Payer: Commercial Managed Care - PPO | Source: Ambulatory Visit | Attending: Orthopedic Surgery | Admitting: Orthopedic Surgery

## 2021-02-13 ENCOUNTER — Other Ambulatory Visit: Payer: Self-pay

## 2021-02-13 ENCOUNTER — Ambulatory Visit: Payer: Commercial Managed Care - PPO

## 2021-02-13 DIAGNOSIS — M778 Other enthesopathies, not elsewhere classified: Secondary | ICD-10-CM | POA: Diagnosis present

## 2021-02-13 DIAGNOSIS — M654 Radial styloid tenosynovitis [de Quervain]: Secondary | ICD-10-CM | POA: Diagnosis present

## 2021-02-13 DIAGNOSIS — M25531 Pain in right wrist: Secondary | ICD-10-CM | POA: Diagnosis present

## 2021-02-14 ENCOUNTER — Other Ambulatory Visit: Payer: Self-pay | Admitting: Orthopedic Surgery

## 2021-02-14 DIAGNOSIS — M25531 Pain in right wrist: Secondary | ICD-10-CM

## 2021-02-14 DIAGNOSIS — M654 Radial styloid tenosynovitis [de Quervain]: Secondary | ICD-10-CM

## 2021-02-14 DIAGNOSIS — M778 Other enthesopathies, not elsewhere classified: Secondary | ICD-10-CM

## 2021-02-15 ENCOUNTER — Ambulatory Visit
Admission: RE | Admit: 2021-02-15 | Discharge: 2021-02-15 | Disposition: A | Discharge status: Home / Self Care | Payer: Commercial Managed Care - PPO | Source: Ambulatory Visit | Attending: Orthopedic Surgery | Admitting: Orthopedic Surgery

## 2021-02-15 DIAGNOSIS — M25531 Pain in right wrist: Secondary | ICD-10-CM

## 2021-02-15 DIAGNOSIS — M778 Other enthesopathies, not elsewhere classified: Secondary | ICD-10-CM

## 2021-02-15 DIAGNOSIS — M654 Radial styloid tenosynovitis [de Quervain]: Secondary | ICD-10-CM

## 2021-03-01 ENCOUNTER — Other Ambulatory Visit: Payer: Self-pay | Admitting: Surgery

## 2021-03-07 ENCOUNTER — Other Ambulatory Visit: Payer: Self-pay

## 2021-03-07 ENCOUNTER — Other Ambulatory Visit
Admission: RE | Admit: 2021-03-07 | Discharge: 2021-03-07 | Disposition: A | Discharge status: Home / Self Care | Payer: Commercial Managed Care - PPO | Source: Ambulatory Visit | Attending: Surgery | Admitting: Surgery

## 2021-03-07 HISTORY — DX: Depression, unspecified: F32.A

## 2021-03-07 NOTE — Patient Instructions (Signed)
Your procedure is scheduled on: 03/14/21 - Wednesday Report to the Registration Desk on the 1st floor of the [facility]. To find out your arrival time, please call [phone] between 1PM - 3PM on: 03/13/21 - Tuesday  REMEMBER: Instructions that are not followed completely may result in serious medical risk, up to and including death; or upon the discretion of your surgeon and anesthesiologist your surgery may need to be rescheduled.  Do not eat food after midnight the night before surgery.  No gum chewing, lozengers or hard candies.  You may however, drink CLEAR liquids up to 2 hours before you are scheduled to arrive for your surgery. Do not drink anything within 2 hours of your scheduled arrival time.  Clear liquids include: - water  - apple juice without pulp - gatorade (not RED, PURPLE, OR BLUE) - black coffee or tea (Do NOT add milk or creamers to the coffee or tea) Do NOT drink anything that is not on this list.   TAKE THESE MEDICATIONS THE MORNING OF SURGERY WITH A SIP OF WATER:  - omeprazole (PRILOSEC) 40 MG capsule take one the night before and one on the morning of surgery - helps to prevent nausea after surgery.  One week prior to surgery: Stop Anti-inflammatories (NSAIDS) such as Advil, Aleve, Ibuprofen, Motrin, Naproxen, Naprosyn and Aspirin based products such as Excedrin, Goodys Powder, BC Powder.  Stop ANY OVER THE COUNTER supplements until after surgery.  You may however, continue to take Tylenol if needed for pain up until the day of surgery.  No Alcohol for 24 hours before or after surgery.  No Smoking including e-cigarettes for 24 hours prior to surgery.  No chewable tobacco products for at least 6 hours prior to surgery.  No nicotine patches on the day of surgery.  Do not use any "recreational" drugs for at least a week prior to your surgery.  Please be advised that the combination of cocaine and anesthesia may have negative outcomes, up to and  including death. If you test positive for cocaine, your surgery will be cancelled.  On the morning of surgery brush your teeth with toothpaste and water, you may rinse your mouth with mouthwash if you wish. Do not swallow any toothpaste or mouthwash.  Do not wear jewelry, make-up, hairpins, clips or nail polish.  Do not wear lotions, powders, or perfumes.   Do not shave body from the neck down 48 hours prior to surgery just in case you cut yourself which could leave a site for infection.  Also, freshly shaved skin may become irritated if using the CHG soap.  Contact lenses, hearing aids and dentures may not be worn into surgery.  Do not bring valuables to the hospital. [facility] is not responsible for any missing/lost belongings or valuables.   Use CHG Soap or wipes as directed on instruction sheet.  Notify your doctor if there is any change in your medical condition (cold, fever, infection).  Wear comfortable clothing (specific to your surgery type) to the hospital.  Plan for stool softeners for home use; pain medications have a tendency to cause constipation. You can also help prevent constipation by eating foods high in fiber such as fruits and vegetables and drinking plenty of fluids as your diet allows.  After surgery, you can help prevent lung complications by doing breathing exercises.  Take deep breaths and cough every 1-2 hours. Your doctor may order a device called an Incentive Spirometer to help you take deep breaths. When  coughing or sneezing, hold a pillow firmly against your incision with both hands. This is called "splinting." Doing this helps protect your incision. It also decreases belly discomfort.  If you are being admitted to the hospital overnight, leave your suitcase in the car. After surgery it may be brought to your room.  If you are being discharged the day of surgery, you will not be allowed to drive home. You will need a responsible adult (18 years or  older) to drive you home and stay with you that night.   If you are taking public transportation, you will need to have a responsible adult (18 years or older) with you. Please confirm with your physician that it is acceptable to use public transportation.   Please call the Pre-admissions Testing Dept. at [phone] if you have any questions about these instructions.  Surgery Visitation Policy:  Patients undergoing a surgery or procedure may have one family member or support person with them as long as that person is not COVID-19 positive or experiencing its symptoms.  That person may remain in the waiting area during the procedure.  Inpatient Visitation:    Visiting hours are 7 a.m. to 8 p.m. Inpatients will be allowed two visitors daily. The visitors may change each day during the patient's stay. No visitors under the age of 60. Any visitor under the age of 44 must be accompanied by an adult. The visitor must pass COVID-19 screenings, use hand sanitizer when entering and exiting the patient's room and wear a mask at all times, including in the patient's room. Patients must also wear a mask when staff or their visitor are in the room. Masking is required regardless of vaccination status.

## 2021-03-13 MED ORDER — LACTATED RINGERS IV SOLN: INTRAVENOUS | Status: DC

## 2021-03-13 MED ORDER — CHLORHEXIDINE GLUCONATE 0.12 % MT SOLN: 15.0000 mL | Freq: Once | OROMUCOSAL | Status: AC

## 2021-03-13 MED ORDER — CEFAZOLIN SODIUM-DEXTROSE 2-4 GM/100ML-% IV SOLN
2.0000 g | INTRAVENOUS | Status: AC
  Administered 2021-03-14: 1 g via INTRAVENOUS
  Administered 2021-03-14: 2 g via INTRAVENOUS

## 2021-03-13 MED ORDER — ORAL CARE MOUTH RINSE: 15.0000 mL | Freq: Once | OROMUCOSAL | Status: AC

## 2021-03-14 ENCOUNTER — Ambulatory Visit: Payer: Commercial Managed Care - PPO | Admitting: Registered Nurse

## 2021-03-14 ENCOUNTER — Other Ambulatory Visit: Payer: Self-pay

## 2021-03-14 ENCOUNTER — Ambulatory Visit
Admission: RE | Admit: 2021-03-14 | Discharge: 2021-03-14 | Disposition: A | Discharge status: Home / Self Care | Payer: Commercial Managed Care - PPO | Attending: Surgery | Admitting: Surgery

## 2021-03-14 ENCOUNTER — Encounter: Payer: Self-pay | Admitting: Surgery

## 2021-03-14 ENCOUNTER — Encounter
Admission: RE | Disposition: A | Discharge status: Home / Self Care | Payer: Self-pay | Source: Home / Self Care | Attending: Surgery

## 2021-03-14 DIAGNOSIS — F17210 Nicotine dependence, cigarettes, uncomplicated: Secondary | ICD-10-CM | POA: Diagnosis not present

## 2021-03-14 DIAGNOSIS — Z8249 Family history of ischemic heart disease and other diseases of the circulatory system: Secondary | ICD-10-CM | POA: Insufficient documentation

## 2021-03-14 DIAGNOSIS — Z833 Family history of diabetes mellitus: Secondary | ICD-10-CM | POA: Insufficient documentation

## 2021-03-14 DIAGNOSIS — Z832 Family history of diseases of the blood and blood-forming organs and certain disorders involving the immune mechanism: Secondary | ICD-10-CM | POA: Insufficient documentation

## 2021-03-14 DIAGNOSIS — Z79899 Other long term (current) drug therapy: Secondary | ICD-10-CM | POA: Insufficient documentation

## 2021-03-14 DIAGNOSIS — Z791 Long term (current) use of non-steroidal anti-inflammatories (NSAID): Secondary | ICD-10-CM | POA: Insufficient documentation

## 2021-03-14 DIAGNOSIS — Z801 Family history of malignant neoplasm of trachea, bronchus and lung: Secondary | ICD-10-CM | POA: Insufficient documentation

## 2021-03-14 DIAGNOSIS — M67431 Ganglion, right wrist: Secondary | ICD-10-CM | POA: Diagnosis present

## 2021-03-14 DIAGNOSIS — Z91040 Latex allergy status: Secondary | ICD-10-CM | POA: Insufficient documentation

## 2021-03-14 HISTORY — PX: GANGLION CYST EXCISION: SHX1691

## 2021-03-14 LAB — POCT I-STAT, CHEM 8
BUN: 12 mg/dL (ref 6–20)
Calcium, Ion: 1.16 mmol/L (ref 1.15–1.40)
Chloride: 101 mmol/L (ref 98–111)
Creatinine, Ser: 0.6 mg/dL (ref 0.44–1.00)
Glucose, Bld: 83 mg/dL (ref 70–99)
HCT: 42 % (ref 36.0–46.0)
Hemoglobin: 14.3 g/dL (ref 12.0–15.0)
Potassium: 4 mmol/L (ref 3.5–5.1)
Sodium: 139 mmol/L (ref 135–145)
TCO2: 27 mmol/L (ref 22–32)

## 2021-03-14 LAB — POCT PREGNANCY, URINE: Preg Test, Ur: NEGATIVE

## 2021-03-14 SURGERY — EXCISION, GANGLION CYST, WRIST
Anesthesia: General | Site: Wrist | Laterality: Right

## 2021-03-14 MED ORDER — ACETAMINOPHEN 10 MG/ML IV SOLN: 1000.0000 mg | Freq: Once | INTRAVENOUS | Status: DC | PRN

## 2021-03-14 MED ORDER — BUPIVACAINE HCL (PF) 0.5 % IJ SOLN
INTRAMUSCULAR | Status: DC | PRN
  Administered 2021-03-14: 10 mL

## 2021-03-14 MED ORDER — ONDANSETRON HCL 4 MG/2ML IJ SOLN
4.0000 mg | Freq: Once | INTRAMUSCULAR | Status: AC | PRN
  Administered 2021-03-14: 4 mg via INTRAVENOUS

## 2021-03-14 MED ORDER — ONDANSETRON HCL 4 MG PO TABS: 4.0000 mg | ORAL_TABLET | Freq: Four times a day (QID) | ORAL | Status: DC | PRN

## 2021-03-14 MED ORDER — OXYCODONE HCL 5 MG/5ML PO SOLN: 5.0000 mg | Freq: Once | ORAL | Status: DC | PRN

## 2021-03-14 MED ORDER — DEXAMETHASONE SODIUM PHOSPHATE 10 MG/ML IJ SOLN
INTRAMUSCULAR | Status: DC | PRN
  Administered 2021-03-14: 6 mg via INTRAVENOUS

## 2021-03-14 MED ORDER — BUPIVACAINE HCL (PF) 0.5 % IJ SOLN
INTRAMUSCULAR | Status: AC
  Filled 2021-03-14: qty 30

## 2021-03-14 MED ORDER — LIDOCAINE HCL (PF) 1 % IJ SOLN
INTRAMUSCULAR | Status: AC
  Filled 2021-03-14: qty 30

## 2021-03-14 MED ORDER — ACETAMINOPHEN 10 MG/ML IV SOLN
INTRAVENOUS | Status: DC | PRN
  Administered 2021-03-14: 1000 mg via INTRAVENOUS

## 2021-03-14 MED ORDER — OXYCODONE HCL 5 MG PO TABS: 5.0000 mg | ORAL_TABLET | Freq: Once | ORAL | Status: DC | PRN

## 2021-03-14 MED ORDER — MIDAZOLAM HCL 2 MG/2ML IJ SOLN
INTRAMUSCULAR | Status: DC | PRN
  Administered 2021-03-14: 2 mg via INTRAVENOUS

## 2021-03-14 MED ORDER — ONDANSETRON HCL 4 MG/2ML IJ SOLN: 4.0000 mg | Freq: Four times a day (QID) | INTRAMUSCULAR | Status: DC | PRN

## 2021-03-14 MED ORDER — ONDANSETRON HCL 4 MG/2ML IJ SOLN
INTRAMUSCULAR | Status: DC | PRN
  Administered 2021-03-14: 4 mg via INTRAVENOUS

## 2021-03-14 MED ORDER — LIDOCAINE HCL (CARDIAC) PF 100 MG/5ML IV SOSY
PREFILLED_SYRINGE | INTRAVENOUS | Status: DC | PRN
  Administered 2021-03-14: 100 mg via INTRAVENOUS

## 2021-03-14 MED ORDER — FENTANYL CITRATE (PF) 100 MCG/2ML IJ SOLN: 25.0000 ug | INTRAMUSCULAR | Status: DC | PRN

## 2021-03-14 MED ORDER — CHLORHEXIDINE GLUCONATE 0.12 % MT SOLN
OROMUCOSAL | Status: AC
  Administered 2021-03-14: 15 mL via OROMUCOSAL
  Filled 2021-03-14: qty 15

## 2021-03-14 MED ORDER — ONDANSETRON HCL 4 MG/2ML IJ SOLN
INTRAMUSCULAR | Status: AC
  Filled 2021-03-14: qty 2

## 2021-03-14 MED ORDER — HYDROCODONE-ACETAMINOPHEN 5-325 MG PO TABS: 1.0000 | ORAL_TABLET | Freq: Four times a day (QID) | ORAL | 0 refills | Status: DC | PRN

## 2021-03-14 MED ORDER — FENTANYL CITRATE (PF) 100 MCG/2ML IJ SOLN
INTRAMUSCULAR | Status: AC
  Filled 2021-03-14: qty 2

## 2021-03-14 MED ORDER — MIDAZOLAM HCL 2 MG/2ML IJ SOLN
INTRAMUSCULAR | Status: AC
  Filled 2021-03-14: qty 2

## 2021-03-14 MED ORDER — POTASSIUM CHLORIDE IN NACL 20-0.9 MEQ/L-% IV SOLN: INTRAVENOUS | Status: DC

## 2021-03-14 MED ORDER — HYDROCODONE-ACETAMINOPHEN 5-325 MG PO TABS: 1.0000 | ORAL_TABLET | ORAL | Status: DC | PRN

## 2021-03-14 MED ORDER — METOCLOPRAMIDE HCL 10 MG PO TABS: 5.0000 mg | ORAL_TABLET | Freq: Three times a day (TID) | ORAL | Status: DC | PRN

## 2021-03-14 MED ORDER — FENTANYL CITRATE (PF) 100 MCG/2ML IJ SOLN
INTRAMUSCULAR | Status: DC | PRN
  Administered 2021-03-14 (×4): 25 ug via INTRAVENOUS

## 2021-03-14 MED ORDER — CEFAZOLIN SODIUM-DEXTROSE 2-4 GM/100ML-% IV SOLN
INTRAVENOUS | Status: AC
  Filled 2021-03-14: qty 100

## 2021-03-14 MED ORDER — DEXMEDETOMIDINE HCL 200 MCG/2ML IV SOLN
INTRAVENOUS | Status: DC | PRN
  Administered 2021-03-14: 8 ug via INTRAVENOUS

## 2021-03-14 MED ORDER — KETAMINE HCL 10 MG/ML IJ SOLN
INTRAMUSCULAR | Status: DC | PRN
  Administered 2021-03-14: 30 mg via INTRAVENOUS
  Administered 2021-03-14: 20 mg via INTRAVENOUS

## 2021-03-14 MED ORDER — LIDOCAINE HCL (PF) 2 % IJ SOLN
INTRAMUSCULAR | Status: AC
  Filled 2021-03-14: qty 5

## 2021-03-14 MED ORDER — CEFAZOLIN SODIUM 1 G IJ SOLR
INTRAMUSCULAR | Status: AC
  Filled 2021-03-14: qty 10

## 2021-03-14 MED ORDER — PROPOFOL 10 MG/ML IV BOLUS
INTRAVENOUS | Status: DC | PRN
  Administered 2021-03-14: 150 mg via INTRAVENOUS

## 2021-03-14 MED ORDER — DEXAMETHASONE SODIUM PHOSPHATE 10 MG/ML IJ SOLN
INTRAMUSCULAR | Status: AC
  Filled 2021-03-14: qty 1

## 2021-03-14 MED ORDER — METOCLOPRAMIDE HCL 5 MG/ML IJ SOLN: 5.0000 mg | Freq: Three times a day (TID) | INTRAMUSCULAR | Status: DC | PRN

## 2021-03-14 MED ORDER — ACETAMINOPHEN 10 MG/ML IV SOLN
INTRAVENOUS | Status: AC
  Filled 2021-03-14: qty 100

## 2021-03-14 SURGICAL SUPPLY — 26 items
BNDG COHESIVE 4X5 TAN STRL (GAUZE/BANDAGES/DRESSINGS) ×2 IMPLANT
BNDG ELASTIC 2X5.8 VLCR STR LF (GAUZE/BANDAGES/DRESSINGS) ×2 IMPLANT
BNDG ESMARK 4X12 TAN STRL LF (GAUZE/BANDAGES/DRESSINGS) ×2 IMPLANT
CANISTER SUCT 1200ML W/VALVE (MISCELLANEOUS) ×2 IMPLANT
CHLORAPREP W/TINT 26 (MISCELLANEOUS) ×2 IMPLANT
CORD BIP STRL DISP 12FT (MISCELLANEOUS) ×2 IMPLANT
COVER WAND RF STERILE (DRAPES) ×2 IMPLANT
CUFF TOURN SGL QUICK 18X4 (TOURNIQUET CUFF) ×2 IMPLANT
FORCEPS JEWEL BIP 4-3/4 STR (INSTRUMENTS) ×2 IMPLANT
GAUZE SPONGE 4X4 12PLY STRL (GAUZE/BANDAGES/DRESSINGS) ×2 IMPLANT
GAUZE XEROFORM 1X8 LF (GAUZE/BANDAGES/DRESSINGS) ×2 IMPLANT
GLOVE SURG ENC MOIS LTX SZ8 (GLOVE) ×2 IMPLANT
GLOVE SURG UNDER LTX SZ8 (GLOVE) ×2 IMPLANT
GOWN STRL REUS W/ TWL XL LVL3 (GOWN DISPOSABLE) ×1 IMPLANT
GOWN STRL REUS W/TWL XL LVL3 (GOWN DISPOSABLE) ×1
KIT TURNOVER KIT A (KITS) ×2 IMPLANT
MANIFOLD NEPTUNE II (INSTRUMENTS) ×2 IMPLANT
NS IRRIG 500ML POUR BTL (IV SOLUTION) ×2 IMPLANT
PACK EXTREMITY ARMC (MISCELLANEOUS) ×2 IMPLANT
SPLINT WRIST LG LT TX990309 (SOFTGOODS) IMPLANT
SPLINT WRIST LG RT TX900304 (SOFTGOODS) IMPLANT
SPLINT WRIST M LT TX990308 (SOFTGOODS) IMPLANT
SPLINT WRIST M RT TX990303 (SOFTGOODS) IMPLANT
STOCKINETTE IMPERVIOUS 9X36 MD (GAUZE/BANDAGES/DRESSINGS) ×2 IMPLANT
STRIP CLOSURE SKIN 1/4X4 (GAUZE/BANDAGES/DRESSINGS) ×2 IMPLANT
SUT PROLENE 4 0 PS 2 18 (SUTURE) ×2 IMPLANT

## 2021-03-14 NOTE — Discharge Instructions (Addendum)
AMBULATORY SURGERY  DISCHARGE INSTRUCTIONS   1) The drugs that you were given will stay in your system until tomorrow so for the next 24 hours you should not:  A) Drive an automobile B) Make any legal decisions C) Drink any alcoholic beverage   2) You may resume regular meals tomorrow.  Today it is better to start with liquids and gradually work up to solid foods.  You may eat anything you prefer, but it is better to start with liquids, then soup and crackers, and gradually work up to solid foods.   3) Please notify your doctor immediately if you have any unusual bleeding, trouble breathing, redness and pain at the surgery site, drainage, fever, or pain not relieved by medication.    4) Additional Instructions:        Please contact your physician with any problems or Same Day Surgery at [phone], Monday through Friday 6 am to 4 pm, or [place] at [facility] number at [phone].Orthopedic discharge instructions: Keep splint dry and intact. Keep hand elevated above heart level. Apply ice to affected area frequently. Take ibuprofen 600-800 mg TID with meals for 5-7 days, then as necessary. Take pain medication as prescribed or ES Tylenol when needed.  Return for follow-up in 10-14 days or as scheduled.

## 2021-03-14 NOTE — H&P (Signed)
History of Present Illness:  Amber Keller is a 35 y.o. female who presents for evaluation and treatment of her radial sided right wrist and hand pain radiating to her right thumb. She notes the present symptoms have been present for 3 to 4 months and developed without any specific cause or injury. However, she notes that her job at [employer] requires her to use her hands for repetitive activities. Beginning in January, she was assigned to one particular task full-time which required her to press down on an object with her thumb repeatedly. The patient feels that this repetitive activity has contributed to the onset of her symptoms. She initially saw Van Clines, PA, who diagnosed her with "arthritis". However, the patient was not comfortable with this diagnosis and therefore saw Dedra Skeens, PA-C, who has evaluated her on several occasions. Work-up has included an EMG as well as an MRI of the right wrist. The patient has been treated with physical therapy, anti-inflammatory medication, and activity modification with little improvement in her symptoms. She has remained out of work since being seen in early February. She denies any numbness or paresthesias to her fingers.  Current Outpatient Medications: . acyclovir (ZOVIRAX) 400 MG tablet TAKE 2 TABLETS BY MOUTH THREE TIMES DAILY FOR 2 DAYS as needed per outbreak 60 tablet 3  . cyanocobalamin (VITAMIN B12) 1,000 mcg/mL injection Inject 1 mL (1,000 mcg total) into the muscle once a week 1 mL 4  . gabapentin (NEURONTIN) 300 MG capsule Take 1 capsule (300 mg total) by mouth 3 (three) times daily 90 capsule 11  . naproxen (NAPROSYN) 500 MG tablet Take 1 tablet (500 mg total) by mouth 2 (two) times daily as needed 30 tablet 11  . norethindrone-ethinyl estradiol (JUNEL 1/20, 21,) 1-20 mg-mcg tablet Take 1 tablet by mouth once daily Take daily for 21 days, no drug for 7 days, then start new pack. 30 tablet 11  . tiZANidine (ZANAFLEX) 2 MG tablet Take 1 tablet (2 mg  total) by mouth nightly as needed 30 tablet 1  . traMADoL (ULTRAM) 50 mg tablet Take 1 tablet (50 mg total) by mouth every 6 (six) hours as needed for Pain 30 tablet 0  . traZODone (DESYREL) 50 MG tablet 1-3 tabs po qhs prn insomnia 90 tablet 11   Allergies:  . Latex Itching   Past Medical History:  . Abnormal Pap smear (December 2013 abnormal)  . Anxiety 05/24/2014 (Doing well on paxil)  . Depression 05/24/2014 (Doing well on paxil)  . Fatty liver disease, nonalcoholic 06/23/2018  . Herpes  . Hypertension last couple of visits to urgent care has been high  . Obesity, Class III, BMI 40-49.9 (morbid obesity) (CMS-HCC) 11/13/2017  . PCOS (polycystic ovarian syndrome) 01/11/2016  . Pre-diabetes 02/17/2015   Past Surgical History:  . KNEE ARTHROSCOPY Left (meniscus tear, arthritis, scarred, bakers cyst)   Family History:  . Diabetes type II Father  . Breast cancer Father  . Clotting disorder Father  . Diabetes type II Paternal Aunt  . Diabetes Paternal Aunt  . Gout Paternal Grandmother  . High blood pressure (Hypertension) Mother  . Cancer Maternal Grandfather (Lung cancer)  . Clotting disorder Maternal Grandmother  . Clotting disorder Maternal Uncle   Social History:   Socioeconomic History:  Marland Kitchen Marital status: Single  Tobacco Use  . Smoking status: Current Every Day Smoker  Packs/day: 1.00  Years: 10.00  Pack years: 10.00  Types: Cigarettes  Start date: 10/07/2002  . Smokeless tobacco: Never Used  Vaping  Use  . Vaping Use: Never used  Substance and Sexual Activity  . Alcohol use: No  . Drug use: No  . Sexual activity: Yes  Partners: Male  Birth control/protection: Condom, None  Other Topics Concern  . Would you please tell us about the people who live in your home, your pets, or anything else important to your social life? No  Social History Narrative  Single, lives alone. Has a boyfriend. 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   Review of Systems:   A comprehensive 14 point ROS was performed, reviewed, and the pertinent orthopaedic findings are documented in the HPI.  Physical Exam: Vitals:  02/23/21 1056  BP: 122/86  Weight: (!) 118.9 kg (262 lb 3.2 oz)  Height: 157.5 cm (5\' 2" )  PainSc: 5  PainLoc: Wrist   General/Constitutional: Pleasant significantly overweight young female in no acute distress. Neuro/Psych: Normal mood and affect, oriented to person, place and time. Eyes: Non-icteric. Pupils are equal, round, and reactive to light, and exhibit synchronous movement. ENT: Unremarkable. Lymphatic: No palpable adenopathy. Respiratory: Lungs clear to auscultation, Normal chest excursion, No wheezes and Non-labored breathing Cardiovascular: Regular rate and rhythm. No murmurs. and No edema, swelling or tenderness, except as noted in detailed exam. Integumentary: No impressive skin lesions present, except as noted in detailed exam. Musculoskeletal: Unremarkable, except as noted in detailed exam.  Right wrist exam: Skin inspection of the right wrist and hand is unremarkable. No swelling, erythema, ecchymosis, abrasions, or other skin abnormalities are identified. There is mild-moderate focal tenderness to firm palpation over the volar radial aspect of the wrist. She has no tenderness along the first dorsal compartment and only minimal tenderness to palpation along the thumb metacarpal. No other areas of tenderness are noted around the wrist or hand. She exhibits near full active and passive range of motion of the wrist with mild discomfort in the volar aspect of the wrist at the extremes of extension more so than flexion. She is able to actively flex and extend all digits without any pain or triggering. She has a negative Finkelstein's test, and a negative Phalen's test. She is neurovascularly intact to all digits. She has a negative Tinel's over the wrist.  X-rays/MRI/Lab data:  A recent MRI scan of the right wrist is available for  review. By report, the scan demonstrates a "ganglion cyst in the volar soft tissues of the radial wrist" which measures approximately 0.9 x 8.5 x 0.7 cm. No other pathology is noted by this study. Both the films and report were reviewed by myself and discussed with the patient.  A recent EMG of the right upper extremity also is available for review and has been reviewed by myself. By report, this study shows no evidence for carpal tunnel syndrome or other neuropathic process affecting the right hand or upper extremity.  Assessment:  Volar carpal ganglion of right wrist.   Plan: The treatment options were discussed with the patient. In addition, patient educational materials were provided regarding the diagnosis and treatment options. The patient is quite frustrated by her symptoms and functional limitations. Based on her examination and study results, I feel that it is most likely that her symptoms are caused by the volar carpal ganglion identified by MRI scan. Therefore, I have recommended a surgical procedure, specifically an excision of the volar carpal ganglion. The procedure was discussed with the patient, as were the potential risks (including bleeding, infection, nerve and/or blood vessel injury, persistent or recurrent pain, recurrence of the  cyst, stiffness of the wrist, weakness of grip, need for further surgery, blood clots, strokes, heart attacks and/or arhythmias, pneumonia, etc.) and benefits. The patient states his/her understanding and wishes to proceed. All of the patient's questions and concerns were answered. She can call any time with further concerns. She will follow up post-surgery, routine. She is to remain out of work during this time.   H&P reviewed and patient re-examined. No changes.

## 2021-03-14 NOTE — Anesthesia Procedure Notes (Signed)
Procedure Name: LMA Insertion Date/Time: 03/14/2021 1:04 PM Performed by: Lynden Oxford, CRNA Pre-anesthesia Checklist: Patient identified, Emergency Drugs available, Suction available and Patient being monitored Patient Re-evaluated:Patient Re-evaluated prior to induction Oxygen Delivery Method: Circle system utilized Preoxygenation: Pre-oxygenation with 100% oxygen Induction Type: IV induction Ventilation: Mask ventilation without difficulty Tube type: Oral Tube size: 4.0 mm Number of attempts: 1 Placement Confirmation: positive ETCO2 and breath sounds checked- equal and bilateral Tube secured with: Tape Dental Injury: Teeth and Oropharynx as per pre-operative assessment

## 2021-03-14 NOTE — Transfer of Care (Signed)
Immediate Anesthesia Transfer of Care Note  Patient: Amber Keller  Procedure(s) Performed: EXCISION OF RIGHT VOLAR CARPAL GANGLION (Right Wrist)  Patient Location: PACU  Anesthesia Type:General  Level of Consciousness: drowsy  Airway & Oxygen Therapy: Patient Spontanous Breathing and Patient connected to face mask oxygen  Post-op Assessment: Report given to RN and Post -op Vital signs reviewed and stable  Post vital signs: Reviewed and stable  Last Vitals:  Vitals Value Taken Time  BP 136/91 03/14/21 1356  Temp    Pulse 56 03/14/21 1359  Resp 15 03/14/21 1359  SpO2 100 % 03/14/21 1359  Vitals shown include unvalidated device data.  Last Pain:  Vitals:   03/14/21 1141  TempSrc: Oral  PainSc: 2          Complications: No complications documented.

## 2021-03-14 NOTE — Op Note (Signed)
03/14/2021  1:57 PM  Patient:   Amber Keller  Pre-Op Diagnosis:   Right volar carpal ganglion.  Post-Op Diagnosis:   Same.  Procedure:   Excision of right volar carpal ganglion.  Surgeon:   Maryagnes Amos, MD  Anesthesia:   General LMA  Findings:   As above.  Complications:   None  EBL:   0 cc  Fluids:   300 cc crystalloid  TT:   26 minutes at 250 mmHg  Drains:   None  Closure:   2-0 Vicryl subcuticular sutures  Brief Clinical Note:   The patient is a 35 year old female with a several month history of progressive worsening radial sided volar right wrist pain. Her symptoms have persisted despite medications, activity modification, etc. Her history and examination are consistent with a volar carpal ganglion, confirmed by MRI scan. The patient presents at this time for excision of the right volar carpal ganglion.  Procedure:   The patient was brought into the operating room and lain in the supine position. After adequate general laryngeal mask anesthesia was obtained, the right hand and upper extremity were prepped with ChloraPrep solution before being draped sterilely. Preoperative antibiotics were administered. A timeout was performed to verify the appropriate surgical site.    An approximately 3-4 cm incision was made longitudinally over the flexor carpi radialis tendon beginning at the wrist flexion crease and extending proximally. The incision was carried down through the subcutaneous tissues with care taken to identify and protect any neurovascular structures. The volar forearm fascia was incised over the flexor carpi radialis tendon and the tendon mobilized ulnarly. The volar portion of the flexor carpi radialis sheath was then incised and dissection carried down toward the volar radial aspect of the wrist. The flexor tendons and median nerve were retracted ulnarly while the radial artery was retracted radially to expose the cyst. Once the cyst was identified, it was  dissected out circumferentially, tracking it down to the stalk, before it was removed. During its removal, a small amount of straw-colored gelatinous material, consistent with a ganglion cyst, was identified. The cyst was tracked down to the radial scaphoid joint. A small amount of capsule was removed from the carpus in order to minimize the likelihood of recurrence.  The wound was copiously irrigated with sterile saline solution before the flexor carpi radialis sheath was reapproximated using 2-0 Vicryl interrupted sutures. The skin also was closed using 2-0 Vicryl subcuticular sutures. Benzoin and Steri-Strips were applied to the skin before a total of 10 cc of 0.5% plain Sensorcaine was injected in and around the incision to help with postoperative analgesia. The patient was placed into a volar splint, maintaining the wrist in neutral flexion. The patient was then awakened, extubated, and returned to the recovery room in satisfactory condition after tolerating the procedure well.

## 2021-03-14 NOTE — Anesthesia Postprocedure Evaluation (Signed)
Anesthesia Post Note  Patient: Amber Keller  Procedure(s) Performed: EXCISION OF RIGHT VOLAR CARPAL GANGLION (Right Wrist)  Patient location during evaluation: PACU Anesthesia Type: General Level of consciousness: awake and alert Pain management: pain level controlled Vital Signs Assessment: post-procedure vital signs reviewed and stable Respiratory status: spontaneous breathing, nonlabored ventilation, respiratory function stable and patient connected to nasal cannula oxygen Cardiovascular status: blood pressure returned to baseline and stable Postop Assessment: no apparent nausea or vomiting Anesthetic complications: no   No complications documented.   Last Vitals:  Vitals:   03/14/21 1437 03/14/21 1440  BP:    Pulse: 75 65  Resp: 17 12  Temp:  36.7 C  SpO2: (!) 89% 96%    Last Pain:  Vitals:   03/14/21 1430  TempSrc:   PainSc: 0-No pain                 Corinda Gubler

## 2021-03-14 NOTE — Anesthesia Preprocedure Evaluation (Signed)
Anesthesia Evaluation  Patient identified by MRN, date of birth, ID band Patient awake  General Assessment Comment:Prior LMA 4 without issue  Reviewed: Allergy & Precautions, NPO status , Patient's Chart, lab work & pertinent test results  History of Anesthesia Complications Negative for: history of anesthetic complications  Airway Mallampati: II  TM Distance: >3 FB Neck ROM: Full    Dental no notable dental hx. (+) Teeth Intact   Pulmonary neg sleep apnea, neg COPD, Current Smoker and Patient abstained from smoking.,    Pulmonary exam normal breath sounds clear to auscultation       Cardiovascular Exercise Tolerance: Good METS(-) hypertension(-) CAD and (-) Past MI negative cardio ROS  (-) dysrhythmias  Rhythm:Regular Rate:Normal - Systolic murmurs    Neuro/Psych PSYCHIATRIC DISORDERS Anxiety Depression negative neurological ROS     GI/Hepatic neg GERD  ,(+)     (-) substance abuse  ,   Endo/Other  neg diabetesMorbid obesity  Renal/GU negative Renal ROS     Musculoskeletal   Abdominal   Peds  Hematology   Anesthesia Other Findings Past Medical History: No date: Anxiety 2021: COVID-19 No date: Depression No date: Genital herpes No date: Obesity, Class III, BMI 40-49.9 (morbid obesity) (HCC) No date: PCOS (polycystic ovarian syndrome) No date: Prediabetes  Reproductive/Obstetrics                             Anesthesia Physical Anesthesia Plan  ASA: III  Anesthesia Plan: General   Post-op Pain Management:    Induction: Intravenous  PONV Risk Score and Plan: 2 and Ondansetron, Dexamethasone and Midazolam  Airway Management Planned: Oral ETT and LMA  Additional Equipment: None  Intra-op Plan:   Post-operative Plan: Extubation in OR  Informed Consent: I have reviewed the patients History and Physical, chart, labs and discussed the procedure including the risks, benefits  and alternatives for the proposed anesthesia with the patient or authorized representative who has indicated his/her understanding and acceptance.     Dental advisory given  Plan Discussed with: CRNA and Surgeon  Anesthesia Plan Comments: (Discussed risks of anesthesia with patient, including PONV, sore throat, lip/dental damage. Rare risks discussed as well, such as cardiorespiratory and neurological sequelae. Patient understands. Patient counseled on benefits of smoking cessation, and increased perioperative risks associated with continued smoking. Patient informed about increased incidence of above perioperative risk due to high BMI. Patient understands. )        Anesthesia Quick Evaluation

## 2021-03-15 ENCOUNTER — Encounter: Payer: Self-pay | Admitting: Surgery

## 2021-03-15 LAB — SURGICAL PATHOLOGY

## 2021-04-12 ENCOUNTER — Ambulatory Visit: Payer: Commercial Managed Care - PPO | Admitting: Dietician

## 2021-04-19 ENCOUNTER — Encounter: Payer: Self-pay | Admitting: Dietician

## 2021-04-19 NOTE — Progress Notes (Signed)
Have not heard back from patient to reschedule her cancelled appointment from 04/12/21. Sent notification to referring provider.

## 2021-05-07 ENCOUNTER — Ambulatory Visit: Payer: Commercial Managed Care - PPO | Attending: Surgery | Admitting: Occupational Therapy

## 2021-05-07 DIAGNOSIS — M6281 Muscle weakness (generalized): Secondary | ICD-10-CM

## 2021-05-07 DIAGNOSIS — M25531 Pain in right wrist: Secondary | ICD-10-CM

## 2021-05-07 DIAGNOSIS — M25631 Stiffness of right wrist, not elsewhere classified: Secondary | ICD-10-CM | POA: Diagnosis present

## 2021-05-10 ENCOUNTER — Ambulatory Visit: Payer: Commercial Managed Care - PPO | Admitting: Occupational Therapy

## 2021-05-10 DIAGNOSIS — M6281 Muscle weakness (generalized): Secondary | ICD-10-CM | POA: Diagnosis not present

## 2021-05-10 DIAGNOSIS — M25531 Pain in right wrist: Secondary | ICD-10-CM

## 2021-05-10 DIAGNOSIS — M25631 Stiffness of right wrist, not elsewhere classified: Secondary | ICD-10-CM

## 2021-05-14 ENCOUNTER — Ambulatory Visit: Payer: Commercial Managed Care - PPO | Admitting: Occupational Therapy

## 2021-05-14 ENCOUNTER — Encounter: Payer: Self-pay | Admitting: Occupational Therapy

## 2021-05-14 DIAGNOSIS — M6281 Muscle weakness (generalized): Secondary | ICD-10-CM

## 2021-05-14 DIAGNOSIS — M25631 Stiffness of right wrist, not elsewhere classified: Secondary | ICD-10-CM

## 2021-05-14 DIAGNOSIS — M25531 Pain in right wrist: Secondary | ICD-10-CM

## 2021-05-14 NOTE — Therapy (Signed)
[facility] [address], [place], [address] Phone: [phone]   Fax:  [phone]  Occupational Therapy Treatment  Patient Details  Name: Amber Keller MRN: [id number] Date of Birth: 15-Apr-1986 Referring Provider (OT): Poggi   Encounter Date: 05/14/2021   OT End of Session - 05/14/21 1501     Visit Number 2    Number of Visits 12    Date for OT Re-Evaluation 06/20/21    OT Start Time 1437    OT Stop Time 1531    OT Time Calculation (min) 54 min    Activity Tolerance Patient tolerated treatment well    Behavior During Therapy [facility] for tasks assessed/performed             Past Medical History:  Diagnosis Date   Anxiety    COVID-19 2021   Depression    Genital herpes    Obesity, Class III, BMI 40-49.9 (morbid obesity) (HCC)    PCOS (polycystic ovarian syndrome)    Prediabetes     Past Surgical History:  Procedure Laterality Date   GANGLION CYST EXCISION Right 03/14/2021   Procedure: EXCISION OF RIGHT VOLAR CARPAL GANGLION;  Surgeon: Christena Flake, MD;  Location: [facility] ORS;  Service: Orthopedics;  Laterality: Right;   KNEE SURGERY Left     There were no vitals filed for this visit.   Subjective Assessment - 05/14/21 1459     Subjective  Today most of the day without splint - still sleeping with it - can wash face, turn doorknob - but not pushing up yet some pull with driving    Pertinent History The patient is a 35 year old female with a several month history of progressive worsening radial sided volar right wrist pain. Her symptoms have persisted despite medications, activity modification, etc. Her history and examination are consistent with a volar carpal ganglion, confirmed by MRI scan. Pt is s/p excision of the right volar carpal ganglion on 03/14/2021.    Currently in Pain? No/denies                [facility] OT Assessment - 05/14/21 0001       AROM   Right Forearm Pronation 90  Degrees    Right Forearm Supination 85 Degrees    Right Wrist Extension 55 Degrees    Right Wrist Flexion 74 Degrees    Right Wrist Radial Deviation 25 Degrees    Right Wrist Ulnar Deviation 28 Degrees             Pt report not wearing brace most of the day -and only night time  Pain better- except with driving in supination and picking up something with weight  And cannot push up -  Pt scar still very tender and not tolerating scar massage, soft and rougher textures - as well as hyper sensitive over proximal palm with massager , and textures  Pt ed on desensitization several times during day  And scar massage after contrast 3 x day           OT Treatments/Exercises (OP) - 05/14/21 0001       RUE Fluidotherapy   Number Minutes Fluidotherapy 8 Minutes    RUE Fluidotherapy Location Hand;Wrist    Comments AROM in all planes prior to ROM and soft tissue            soft tissue mobs done to CT spreads and webspace and pt to have family assist 2 x day  doing it after contrast   Pt tight in thumb CMC and webspace   Full fisting on right, opposition to small finger and down to base of 5th without pain    Pt to cont contract for edema control,  Cont with  CICA care scar pad to place over scar at night to promote healing of scar.    Pt instructed on scar massage over scar to be performed daily 2-3 times a day to decrease adhesions and to promote scar healing.    Pt instructed on home exercise program for AAROM of wrist  flexion, ext, RD, UD over edge of table  And prayer stretch  And followed by AROM for wrist in all planes  And if needed supination AAROM end range  Showed progress in in all planes .        OT Education - 05/14/21 1500     Education Details progress and changes to HEP    Person(s) Educated Patient    Methods Explanation;Demonstration;Handout    Comprehension Verbalized understanding;Need further instruction                 OT Long Term  Goals - 05/14/21 1761       OT LONG TERM GOAL #1   Title Pt will be independent with home exercise program.    Baseline no current program    Time 6    Period Weeks    Status New    Target Date 06/20/21      OT LONG TERM GOAL #2   Title Pt will improve FOTO score to 71 or above to demonstrate a clinically relevant change to impact greater independence in necessary daily tasks.    Baseline 62 at eval    Time 6    Period Weeks    Status New    Target Date 06/20/21      OT LONG TERM GOAL #3   Title Pt will demonstrate understanding of use of contrast to decrease edema.    Baseline no current knowledge    Time 2    Period Weeks    Status New    Target Date 05/21/21      OT LONG TERM GOAL #4   Title Pt will demonstrate improvement in wrist motion by 15 degrees to perform self care tasks with modified independence.    Baseline see flowsheet for details    Time 6    Period Weeks    Status New    Target Date 06/20/21      OT LONG TERM GOAL #5   Title Pt will demonstrate improvement in right supination by 10 degrees to assist with IADL tasks.    Baseline 70 degrees    Time 6    Period Weeks    Status New    Target Date 06/20/21                   Plan - 05/14/21 1501     Clinical Impression Statement Pt is a 35 yo female who is s/p excision of right volar carpal ganglion on 03/14/2021 (8.5 weeks) who was referred for hand therapy OT evaluation. Pt this date show increase AROM in wrist flexion more than extention and supination improved to 85 degrees. Pt cont to be very tender and senstive with scar massage , and different textures over scar and proximal palm - pt reprt cont numbness in proximal palm - but also not able to tolerate massage or different textures. Digits AROM  WNL and oppostion - but tight with thumb hyper ext out of palm and webspace. Pt cont to who mild pain at times, decreased ROM, decreased strength and decreased ability to perform select daily self  care/IADL tasks including work related tasks.  She would benefit from skilled OT services to maximize her safety and independence with necessary daily tasks.  Issued and instructed on HEP, scar massage, use of contrast and CICA care for scar management.    OT Occupational Profile and History Detailed Assessment- Review of Records and additional review of physical, cognitive, psychosocial history related to current functional performance    Occupational performance deficits (Please refer to evaluation for details): IADL's;ADL's;Work    Body Structure / Function / Physical Skills ADL;Dexterity;Flexibility;ROM;Strength;Coordination;FMC;IADL;Scar mobility;Pain;Sensation;UE functional use    Psychosocial Skills Environmental  Adaptations;Habits;Routines and Behaviors    Rehab Potential Excellent    Clinical Decision Making Limited treatment options, no task modification necessary    Comorbidities Affecting Occupational Performance: May have comorbidities impacting occupational performance    Modification or Assistance to Complete Evaluation  No modification of tasks or assist necessary to complete eval    OT Frequency 2x / week    OT Duration 6 weeks    OT Treatment/Interventions Self-care/ADL training;Cryotherapy;Paraffin;Therapeutic exercise;DME and/or AE instruction;Ultrasound;Fluidtherapy;Neuromuscular education;Manual Therapy;Scar mobilization;Moist Heat;Contrast Bath;Passive range of motion;Therapeutic activities;Patient/family education    Consulted and Agree with Plan of Care Patient             Patient will benefit from skilled therapeutic intervention in order to improve the following deficits and impairments:   Body Structure / Function / Physical Skills: ADL, Dexterity, Flexibility, ROM, Strength, Coordination, FMC, IADL, Scar mobility, Pain, Sensation, UE functional use   Psychosocial Skills: Environmental  Adaptations, Habits, Routines and Behaviors   Visit Diagnosis: Muscle  weakness (generalized)  Pain in right wrist  Stiffness of right wrist, not elsewhere classified    Problem List Patient Active Problem List   Diagnosis Date Noted   PCOS (polycystic ovarian syndrome)    Prediabetes    Obesity, Class III, BMI 40-49.9 (morbid obesity) (HCC)     Latica Hohmann OTR/L,CLT 05/14/2021, 6:46 PM  [facility] [address] [place], [address] Phone: [phone]   Fax:  [phone]  Name: Amber Keller MRN: [id number] Date of Birth: 11/12/1985

## 2021-05-14 NOTE — Therapy (Signed)
[facility] [address] [place], [address] Phone: [phone]   Fax:  [phone]  Occupational Therapy Evaluation  Patient Details  Name: Amber Keller MRN: [id number] Date of Birth: [birth year] Referring Provider (OT): Poggi   Encounter Date: 05/07/2021   OT End of Session - 05/14/21 0926     Visit Number 1    Number of Visits 12    Date for OT Re-Evaluation 06/20/21    OT Start Time 1432    OT Stop Time 1530    OT Time Calculation (min) 58 min    Activity Tolerance Patient tolerated treatment well    Behavior During Therapy [facility] for tasks assessed/performed             Past Medical History:  Diagnosis Date   Anxiety    COVID-19 2021   Depression    Genital herpes    Obesity, Class III, BMI 40-49.9 (morbid obesity) (HCC)    PCOS (polycystic ovarian syndrome)    Prediabetes     Past Surgical History:  Procedure Laterality Date   GANGLION CYST EXCISION Right 03/14/2021   Procedure: EXCISION OF RIGHT VOLAR CARPAL GANGLION;  Surgeon: Christena Flake, MD;  Location: [facility] ORS;  Service: Orthopedics;  Laterality: Right;   KNEE SURGERY Left     There were no vitals filed for this visit.   Subjective Assessment - 05/13/21 0919     Subjective  Pt reports issues started off at work she was trying to push down headlight pieces, started to have pain and started "babying" her hand and arm.  Was diagnosed with DJD initially.  She did PT for a while in [place] and then had an MRI, revealed a cyst and was referred to Dr. Joice Lofts, decided to have surgery.    Pertinent History The patient is a 35 year old female with a several month history of progressive worsening radial sided volar right wrist pain. Her symptoms have persisted despite medications, activity modification, etc. Her history and examination are consistent with a volar carpal ganglion, confirmed by MRI scan. Pt is s/p excision of the right volar  carpal ganglion on 03/14/2021.    Patient Stated Goals Pt would like to get back to using her right hand as she did before, return to work.    Currently in Pain? No/denies    Multiple Pain Sites No               [facility] OT Assessment - 05/13/21 0921       Assessment   Medical Diagnosis Right ganglion cyst s/p surgery    Referring Provider (OT) Poggi    Hand Dominance Right    Prior Therapy yes prior to surgery      Balance Screen   Has the patient fallen in the past 6 months No      Home  Environment   Family/patient expects to be discharged to: Private residence    Living Arrangements Parent    Available Help at Discharge Family    Type of Home House    Home Layout One level    Bathroom Shower/Tub Walk-in Shower;Curtain    Bathroom Toilet Handicapped height    Lives With Family      Prior Function   Level of Independence Independent    Vocation Full time employment    Vocation Requirements works at [employer], 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 Uses  drills, circlet pliars.    Leisure watches TV      ADL   Eating/Feeding Modified independent   wears brace   Grooming Modified independent    Upper Body Bathing Modified independent    Lower Body Bathing Modified independent    Upper Body Dressing Independent    Lower Body Dressing Independent    ADL comments Pt currently unable to work.  Has a job in Doctor, general practice parts.      IADL   Prior Level of Function Shopping independent    Shopping Shops independently for small purchases    Prior Level of Function Light Housekeeping independent    Light Housekeeping Performs light daily tasks such as dishwashing, bed making    Prior Level of Function Meal Prep independent    Meal Prep Able to complete simple warm meal prep    Prior Level of Function Arts administrator own vehicle    Prior Level of Function Medication  Managment independent    Medication Management Is responsible for taking medication in correct dosages at correct time    Prior Level of Function Chemical engineer financial matters independently (budgets, writes checks, pays rent, bills goes to bank), collects and keeps track of income      Written Expression   Dominant Hand Right      Observation/Other Assessments   Focus on Therapeutic Outcomes (FOTO)  62      Sensation   Light Touch Appears Intact    Hot/Cold Appears Intact    Additional Comments Pt reports some diminished sensation at base of palm.      Coordination   Finger Nose Finger Test intact      AROM   Right Forearm Pronation 90 Degrees    Right Forearm Supination 70 Degrees    Left Forearm Pronation 90 Degrees    Left Forearm Supination 90 Degrees    Right Wrist Extension 50 Degrees    Right Wrist Flexion 55 Degrees    Right Wrist Radial Deviation 20 Degrees    Right Wrist Ulnar Deviation 15 Degrees    Left Wrist Extension 70 Degrees    Left Wrist Flexion 80 Degrees    Left Wrist Radial Deviation 30 Degrees    Left Wrist Ulnar Deviation 25 Degrees      Strength   Right Hand Grip (lbs) 10    Left Hand Grip (lbs) 30            Full fisting on right, opposition to small finger and down to PIP joint but not to the base of the small finger.    Treatment:  Pt educated on use of contract for edema control, performed in clinic for 11 mins, alternating hot and cold intervals. Issued written instructions to perform at home 2-3 times a day.  Pt issued CICA care scar pad to place over scar at night to promote healing of scar.   Pt instructed on scar massage over scar to be performed daily 2-3 times a day to decrease adhesions and to promote scar healing. Some sensitivity over scar this date.   Pt instructed on home exercise program for ROM of wrist and hand. Tabletop stretches for wrist flexion, extension, radial and  ulnar deviation and prayer stretch.                   OT Education - 05/14/21 [phone]     Education Details role  of OT, goals, POC, HEP    Person(s) Educated Patient    Methods Explanation;Demonstration;Handout    Comprehension Verbalized understanding;Need further instruction                 OT Long Term Goals - 05/14/21 2992       OT LONG TERM GOAL #1   Title Pt will be independent with home exercise program.    Baseline no current program    Time 6    Period Weeks    Status New    Target Date 06/20/21      OT LONG TERM GOAL #2   Title Pt will improve FOTO score to 71 or above to demonstrate a clinically relevant change to impact greater independence in necessary daily tasks.    Baseline 62 at eval    Time 6    Period Weeks    Status New    Target Date 06/20/21      OT LONG TERM GOAL #3   Title Pt will demonstrate understanding of use of contrast to decrease edema.    Baseline no current knowledge    Time 2    Period Weeks    Status New    Target Date 05/21/21      OT LONG TERM GOAL #4   Title Pt will demonstrate improvement in wrist motion by 15 degrees to perform self care tasks with modified independence.    Baseline see flowsheet for details    Time 6    Period Weeks    Status New    Target Date 06/20/21      OT LONG TERM GOAL #5   Title Pt will demonstrate improvement in right supination by 10 degrees to assist with IADL tasks.    Baseline 70 degrees    Time 6    Period Weeks    Status New    Target Date 06/20/21                   Plan - 05/14/21 4268     Clinical Impression Statement Pt is a 35 yo female who is s/p excision of right volar carpal ganglion on 03/14/2021 (7.5 weeks) who was referred for hand therapy OT evaluation.  Pt presents with mild pain at times, decreased ROM, decreased strength and decreased ability to perform select daily self care/IADL tasks including work related tasks.  She would benefit from skilled  OT services to maximize her safety and independence with necessary daily tasks.  Issued and instructed on HEP, scar massage, use of contrast and CICA care for scar management.    OT Occupational Profile and History Detailed Assessment- Review of Records and additional review of physical, cognitive, psychosocial history related to current functional performance    Occupational performance deficits (Please refer to evaluation for details): IADL's;ADL's;Work    Body Structure / Function / Physical Skills ADL;Dexterity;Flexibility;ROM;Strength;Coordination;FMC;IADL;Scar mobility;Pain;Sensation;UE functional use    Psychosocial Skills Environmental  Adaptations;Habits;Routines and Behaviors    Rehab Potential Excellent    Clinical Decision Making Limited treatment options, no task modification necessary    Comorbidities Affecting Occupational Performance: May have comorbidities impacting occupational performance    Modification or Assistance to Complete Evaluation  No modification of tasks or assist necessary to complete eval    OT Frequency 2x / week    OT Duration 6 weeks    OT Treatment/Interventions Self-care/ADL training;Cryotherapy;Paraffin;Therapeutic exercise;DME and/or AE instruction;Ultrasound;Fluidtherapy;Neuromuscular education;Manual Therapy;Scar mobilization;Moist Heat;Contrast Bath;Passive range of motion;Therapeutic activities;Patient/family education  Consulted and Agree with Plan of Care Patient             Patient will benefit from skilled therapeutic intervention in order to improve the following deficits and impairments:   Body Structure / Function / Physical Skills: ADL, Dexterity, Flexibility, ROM, Strength, Coordination, FMC, IADL, Scar mobility, Pain, Sensation, UE functional use   Psychosocial Skills: Environmental  Adaptations, Habits, Routines and Behaviors   Visit Diagnosis: Muscle weakness (generalized)  Pain in right wrist  Stiffness of right wrist, not  elsewhere classified    Problem List Patient Active Problem List   Diagnosis Date Noted   PCOS (polycystic ovarian syndrome)    Prediabetes    Obesity, Class III, BMI 40-49.9 (morbid obesity) (HCC)    Amber Keller, OTR/L, CLT  Amber Keller 05/14/2021, 9:47 AM  [place] [facility] [address] [place], [place], [address] Phone: [phone]   Fax:  [phone]  Name: Amber Keller MRN: [id number] Date of Birth: 08/29/1986

## 2021-05-14 NOTE — Therapy (Signed)
[facility] [address] [place], [address] Phone: [phone]   Fax:  [phone]  Occupational Therapy Treatment  Patient Details  Name: Amber Keller MRN: [id number] Date of Birth: [birth year] Referring Provider (OT): Poggi   Encounter Date: 05/10/2021   OT End of Session - 05/13/21 0952     Visit Number 2    Number of Visits 12    Date for OT Re-Evaluation 06/20/21    OT Start Time 1530    OT Stop Time 1629    OT Time Calculation (min) 59 min    Activity Tolerance Patient tolerated treatment well    Behavior During Therapy [facility] for tasks assessed/performed             Past Medical History:  Diagnosis Date   Anxiety    COVID-19 2021   Depression    Genital herpes    Obesity, Class III, BMI 40-49.9 (morbid obesity) (HCC)    PCOS (polycystic ovarian syndrome)    Prediabetes     Past Surgical History:  Procedure Laterality Date   GANGLION CYST EXCISION Right 03/14/2021   Procedure: EXCISION OF RIGHT VOLAR CARPAL GANGLION;  Surgeon: Christena Flake, MD;  Location: [facility] ORS;  Service: Orthopedics;  Laterality: Right;   KNEE SURGERY Left     There were no vitals filed for this visit.   Subjective Assessment - 05/13/21 0951     Subjective  Pt reports she did use contrast at home as well as scar pad. Performed exercises as directed. Feels the hand and wrist are a bit better.    Pertinent History The patient is a 35 year old female with a several month history of progressive worsening radial sided volar right wrist pain. Her symptoms have persisted despite medications, activity modification, etc. Her history and examination are consistent with a volar carpal ganglion, confirmed by MRI scan. Pt is s/p excision of the right volar carpal ganglion on 03/14/2021.    Patient Stated Goals Pt would like to get back to using her right hand as she did before, return to work.    Currently in Pain? No/denies     Multiple Pain Sites No             Pt seen for use of fluidotherapy for 12 mins total, also intervals of cold for 1 min for edema control. While in fluido, patient performed active movement of wrist, hand and digits.  Manual therapy: Scar massage performed to promote scar healing,'decrease risk of adhesions and improve ROM.  Therapeutic Exercises:  PROM followed by [facility] for wrist flexion, extension, ulnar and radial deviation, supination of forearm with manual stretch. Digit flexion/extension, opposition.   Review of HEP Measurements taken for ROM see flow sheet for details.  Response to tx:  Pt progressing well, reinstruction on use of CICA care, pt had a few questions regarding use. Improved ROM this date with wrist and hand. Decreased edema noted this date with visual inspection. Supination limited but improved this date with measurements.  Discussed last session about weaning off brace gradually and patient reports she didn't wear it at all yesterday but by he evening she could tell and had to put it on. Discussed that this should be a gradual process and  going forward to try to wear during heavier use times but remove at rest. She demonstrates understanding. Continue towards goals to maximize safety and independence in necessary daily tasks.     [facility] OT  Assessment - 05/13/21 1000       Assessment   Medical Diagnosis Right ganglion cyst s/p surgery    Referring Provider (OT) Poggi    Hand Dominance Right    Prior Therapy yes prior to surgery      Balance Screen   Has the patient fallen in the past 6 months No      Home  Environment   Family/patient expects to be discharged to: Private residence    Living Arrangements Parent    Available Help at Discharge Family    Type of Home House    Home Layout One level    Bathroom Shower/Tub Walk-in Shower;Curtain    Bathroom Toilet Handicapped height    Lives With Family      Prior Function   Level of Independence  Independent    Vocation Full time employment    Vocation Requirements works at [employer], 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 Uses drills, circlet pliars.    Leisure watches TV      ADL   Eating/Feeding Modified independent   wears brace   Grooming Modified independent    Upper Body Bathing Modified independent    Lower Body Bathing Modified independent    Upper Body Dressing Independent    Lower Body Dressing Independent    ADL comments Pt currently unable to work.  Has a job in Doctor, general practiceproduction handling parts.      IADL   Prior Level of Function Shopping independent    Shopping Shops independently for small purchases    Prior Level of Function Light Housekeeping independent    Light Housekeeping Performs light daily tasks such as dishwashing, bed making    Prior Level of Function Meal Prep independent    Meal Prep Able to complete simple warm meal prep    Prior Level of Function Arts administratorCommunity Mobility independent    Community Mobility Drives own vehicle    Prior Level of Function Medication Managment independent    Medication Management Is responsible for taking medication in correct dosages at correct time    Prior Level of Function Chemical engineerinancial Management independent    Financial Management Manages financial matters independently (budgets, writes checks, pays rent, bills goes to bank), collects and keeps track of income      Observation/Other Assessments   Focus on Therapeutic Outcomes (FOTO)  62      Sensation   Light Touch Appears Intact    Hot/Cold Appears Intact    Additional Comments Pt reports some diminished sensation at base of palm.      Coordination   Finger Nose Finger Test intact      AROM   Right Forearm Pronation 90 Degrees    Right Forearm Supination 73 Degrees    Left Forearm Pronation 90 Degrees    Left Forearm Supination 90 Degrees    Right Wrist Extension 52 Degrees    Right Wrist Flexion 59 Degrees     Right Wrist Radial Deviation 20 Degrees    Right Wrist Ulnar Deviation 15 Degrees    Left Wrist Extension 70 Degrees    Left Wrist Flexion 80 Degrees    Left Wrist Radial Deviation 30 Degrees    Left Wrist Ulnar Deviation 25 Degrees      Strength   Right Hand Grip (lbs) 10    Left Hand Grip (lbs) 30  OT Education - 05/14/21 [phone]     Education Details HEP, contrast, scar management    Person(s) Educated Patient    Methods Explanation;Demonstration;Handout    Comprehension Verbalized understanding;Need further instruction                 OT Long Term Goals - 05/14/21 2440       OT LONG TERM GOAL #1   Title Pt will be independent with home exercise program.    Baseline no current program    Time 6    Period Weeks    Status New    Target Date 06/20/21      OT LONG TERM GOAL #2   Title Pt will improve FOTO score to 71 or above to demonstrate a clinically relevant change to impact greater independence in necessary daily tasks.    Baseline 62 at eval    Time 6    Period Weeks    Status New    Target Date 06/20/21      OT LONG TERM GOAL #3   Title Pt will demonstrate understanding of use of contrast to decrease edema.    Baseline no current knowledge    Time 2    Period Weeks    Status New    Target Date 05/21/21      OT LONG TERM GOAL #4   Title Pt will demonstrate improvement in wrist motion by 15 degrees to perform self care tasks with modified independence.    Baseline see flowsheet for details    Time 6    Period Weeks    Status New    Target Date 06/20/21      OT LONG TERM GOAL #5   Title Pt will demonstrate improvement in right supination by 10 degrees to assist with IADL tasks.    Baseline 70 degrees    Time 6    Period Weeks    Status New    Target Date 06/20/21                   Plan - 05/14/21 0953     OT Occupational Profile and History Detailed Assessment- Review of Records and  additional review of physical, cognitive, psychosocial history related to current functional performance    Occupational performance deficits (Please refer to evaluation for details): IADL's;ADL's;Work    Body Structure / Function / Physical Skills ADL;Dexterity;Flexibility;ROM;Strength;Coordination;FMC;IADL;Scar mobility;Pain;Sensation;UE functional use    Psychosocial Skills Environmental  Adaptations;Habits;Routines and Behaviors    Rehab Potential Excellent    Clinical Decision Making Limited treatment options, no task modification necessary    Comorbidities Affecting Occupational Performance: May have comorbidities impacting occupational performance    Modification or Assistance to Complete Evaluation  No modification of tasks or assist necessary to complete eval    OT Frequency 2x / week    OT Duration 6 weeks    OT Treatment/Interventions Self-care/ADL training;Cryotherapy;Paraffin;Therapeutic exercise;DME and/or AE instruction;Ultrasound;Fluidtherapy;Neuromuscular education;Manual Therapy;Scar mobilization;Moist Heat;Contrast Bath;Passive range of motion;Therapeutic activities;Patient/family education    Consulted and Agree with Plan of Care Patient             Patient will benefit from skilled therapeutic intervention in order to improve the following deficits and impairments:   Body Structure / Function / Physical Skills: ADL, Dexterity, Flexibility, ROM, Strength, Coordination, FMC, IADL, Scar mobility, Pain, Sensation, UE functional use   Psychosocial Skills: Environmental  Adaptations, Habits, Routines and Behaviors   Visit Diagnosis: Muscle weakness (generalized)  Pain in right wrist  Stiffness of right wrist, not elsewhere classified    Problem List Patient Active Problem List   Diagnosis Date Noted   PCOS (polycystic ovarian syndrome)    Prediabetes    Obesity, Class III, BMI 40-49.9 (morbid obesity) (HCC)    Zaineb Nowaczyk T Margurite Duffy, OTR/L, CLT  Kaeli Nichelson 05/14/2021,  10:04 AM  [place] [facility] [address] [place], [address] Phone: [phone]   Fax:  [phone]  Name: Amber Keller MRN: [id number] Date of Birth: [birth year]

## 2021-05-18 ENCOUNTER — Other Ambulatory Visit: Payer: Self-pay

## 2021-05-18 ENCOUNTER — Ambulatory Visit: Payer: Commercial Managed Care - PPO | Admitting: Occupational Therapy

## 2021-05-18 DIAGNOSIS — M6281 Muscle weakness (generalized): Secondary | ICD-10-CM

## 2021-05-18 DIAGNOSIS — M25631 Stiffness of right wrist, not elsewhere classified: Secondary | ICD-10-CM

## 2021-05-18 DIAGNOSIS — M25531 Pain in right wrist: Secondary | ICD-10-CM

## 2021-05-18 NOTE — Therapy (Signed)
[place] [facility] [address] [place], [address] Phone: [phone]   Fax:  [phone]  Occupational Therapy Treatment  Patient Details  Name: Amber Keller MRN: [id number] Date of Birth: [birth year] Referring Provider (OT): Poggi   Encounter Date: 05/18/2021   OT End of Session - 05/18/21 1043     Visit Number 3    Number of Visits 12    Date for OT Re-Evaluation 06/20/21    OT Start Time 1000    OT Stop Time 1040    OT Time Calculation (min) 40 min    Activity Tolerance Patient tolerated treatment well    Behavior During Therapy [facility] for tasks assessed/performed             Past Medical History:  Diagnosis Date   Anxiety    COVID-19 2021   Depression    Genital herpes    Obesity, Class III, BMI 40-49.9 (morbid obesity) (HCC)    PCOS (polycystic ovarian syndrome)    Prediabetes     Past Surgical History:  Procedure Laterality Date   GANGLION CYST EXCISION Right 03/14/2021   Procedure: EXCISION OF RIGHT VOLAR CARPAL GANGLION;  Surgeon: Christena Flake, MD;  Location: [facility] ORS;  Service: Orthopedics;  Laterality: Right;   KNEE SURGERY Left     There were no vitals filed for this visit.   Subjective Assessment - 05/18/21 1003     Subjective  Doing okay  - scar moving better in the circles- up and down and across still little stuck -  and I did not had my splint on since last time - sensitivity also better    Pertinent History The patient is a 35 year old female with a several month history of progressive worsening radial sided volar right wrist pain. Her symptoms have persisted despite medications, activity modification, etc. Her history and examination are consistent with a volar carpal ganglion, confirmed by MRI scan. Pt is s/p excision of the right volar carpal ganglion on 03/14/2021.    Patient Stated Goals Pt would like to get back to using her right hand as she did before, return to work.     Currently in Pain? Yes    Pain Score 1     Pain Location Hand    Pain Orientation Right    Pain Descriptors / Indicators Discomfort                [facility] OT Assessment - 05/18/21 0001       AROM   Right Wrist Extension 64 Degrees    Right Wrist Flexion 80 Degrees      Strength   Right Hand Grip (lbs) 25    Right Hand Lateral Pinch 18 lbs    Right Hand 3 Point Pinch 10 lbs    Left Hand Grip (lbs) 30    Left Hand Lateral Pinch 18 lbs    Left Hand 3 Point Pinch 14 lbs           Great progress today in scar adhesion and wrist AROM   Supination WNL this date  Grip increase greatly - pt report L grip decrease - had accident when she was little - fracture in L arm     Pt report not wearing brace since seen last time - doing okay - not pain more discomfort at times  Cannot push up yet -  Pt scar  improved greatly since last time - still tender, but could  tolerate this date scar massage by OT - and using mini massager - as well as different textures  Hyper sensitive over proximal palm  and scar improving - pt to cont with it  But do scar massage with wrist in slight flexion and slight extention  Cont with desensitization several times during day  And scar massage after contrast 3 x day                       OT Treatments/Exercises (OP) - 05/18/21 0001       RUE Fluidotherapy   Number Minutes Fluidotherapy 8 Minutes    RUE Fluidotherapy Location Hand;Wrist    Comments AROM for wrist in all planes            soft tissue mobs done to CT spreads and webspace and pt to have family assist 2 x day doing it after contrast     Full fisting on right, opposition to small finger and down to base of 5th without pain    Pt to cont contract for edema control,  Cont with  C[employer] care scar pad to place over scar at night to promote healing of scar.  But this date done some kinesiotape 30% parallel and 2 across 100% to wear for 24 hrs and removed if any issues -  otherwise can redo on Sunday    Pt instructed on scar massage over scar to be performed daily 2-3 times a day to decrease adhesions and to promote scar healing.- but wrist in flexion and extention     Pt instructed on home exercise program for AAROM of wrist  flexion, ext, RD, UD over edge of table  And prayer stretch  And wrist flexion over armrest  Was able to do table slides today for wrist extention - 20 reps - 2 x day painfree   And followed by AROM for wrist in all planes         OT Education - 05/18/21 1043     Education Details progress and changes to HEP    Person(s) Educated Patient    Methods Explanation;Demonstration;Handout    Comprehension Verbalized understanding;Need further instruction                 OT Long Term Goals - 05/14/21 0938       OT LONG TERM GOAL #1   Title Pt will be independent with home exercise program.    Baseline no current program    Time 6    Period Weeks    Status New    Target Date 06/20/21      OT LONG TERM GOAL #2   Title Pt will improve FOTO score to 71 or above to demonstrate a clinically relevant change to impact greater independence in necessary daily tasks.    Baseline 62 at eval    Time 6    Period Weeks    Status New    Target Date 06/20/21      OT LONG TERM GOAL #3   Title Pt will demonstrate understanding of use of contrast to decrease edema.    Baseline no current knowledge    Time 2    Period Weeks    Status New    Target Date 05/21/21      OT LONG TERM GOAL #4   Title Pt will demonstrate improvement in wrist motion by 15 degrees to perform self care tasks with modified independence.    Baseline see flowsheet  for details    Time 6    Period Weeks    Status New    Target Date 06/20/21      OT LONG TERM GOAL #5   Title Pt will demonstrate improvement in right supination by 10 degrees to assist with IADL tasks.    Baseline 70 degrees    Time 6    Period Weeks    Status New    Target Date  06/20/21                   Plan - 05/18/21 1043     Clinical Impression Statement Pt is a 35 yo female who is s/p excision of right volar carpal ganglion on 03/14/2021 (9.5 weeks) who was referred for hand therapy OT evaluation. Pt this date show increase AROM in wrist flexion and extention after reinforcing scar mobs and desentitzation last visit- supination this date WNL - and scar adhesion doing better and sensitivty. Digits AROM WNL and oppostion - but tight with thumb hyper ext out of palm and webspace. Pt report more discomfort than pain - grip increase 15 lbs compare to eval - lat grip WNL -but 3 point pinch decrease - cont with tenderness, decreased ROM, decreased strength and decreased ability to perform select daily self care/IADL tasks including work related tasks.  She would benefit from skilled OT services to maximize her safety and independence with necessary daily tasks.    OT Occupational Profile and History Detailed Assessment- Review of Records and additional review of physical, cognitive, psychosocial history related to current functional performance    Occupational performance deficits (Please refer to evaluation for details): IADL's;ADL's;Work    Body Structure / Function / Physical Skills ADL;Dexterity;Flexibility;ROM;Strength;Coordination;FMC;IADL;Scar mobility;Pain;Sensation;UE functional use    Psychosocial Skills Environmental  Adaptations;Habits;Routines and Behaviors    Rehab Potential Excellent    Clinical Decision Making Limited treatment options, no task modification necessary    Comorbidities Affecting Occupational Performance: May have comorbidities impacting occupational performance    Modification or Assistance to Complete Evaluation  No modification of tasks or assist necessary to complete eval    OT Frequency 2x / week    OT Duration 6 weeks    OT Treatment/Interventions Self-care/ADL training;Cryotherapy;Paraffin;Therapeutic exercise;DME and/or AE  instruction;Ultrasound;Fluidtherapy;Neuromuscular education;Manual Therapy;Scar mobilization;Moist Heat;Contrast Bath;Passive range of motion;Therapeutic activities;Patient/family education    Consulted and Agree with Plan of Care Patient             Patient will benefit from skilled therapeutic intervention in order to improve the following deficits and impairments:   Body Structure / Function / Physical Skills: ADL, Dexterity, Flexibility, ROM, Strength, Coordination, FMC, IADL, Scar mobility, Pain, Sensation, UE functional use   Psychosocial Skills: Environmental  Adaptations, Habits, Routines and Behaviors   Visit Diagnosis: Pain in right wrist  Stiffness of right wrist, not elsewhere classified  Muscle weakness (generalized)    Problem List Patient Active Problem List   Diagnosis Date Noted   PCOS (polycystic ovarian syndrome)    Prediabetes    Obesity, Class III, BMI 40-49.9 (morbid obesity) (HCC)     Kamari Bilek OTR/L,CLT 05/18/2021, 12:21 PM  [place] [facility] [address], [place], [address] Phone: [phone]   Fax:  [phone]  Name: Amber Keller MRN: [id number] Date of Birth: [birth year]

## 2021-05-21 ENCOUNTER — Ambulatory Visit: Payer: Commercial Managed Care - PPO | Admitting: Occupational Therapy

## 2021-05-21 DIAGNOSIS — M6281 Muscle weakness (generalized): Secondary | ICD-10-CM

## 2021-05-21 DIAGNOSIS — M25631 Stiffness of right wrist, not elsewhere classified: Secondary | ICD-10-CM

## 2021-05-21 DIAGNOSIS — M25531 Pain in right wrist: Secondary | ICD-10-CM

## 2021-05-21 NOTE — Therapy (Signed)
[facility] [address] [place], [address] Phone: [phone]   Fax:  [phone]  Occupational Therapy Treatment  Patient Details  Name: Amber Keller MRN: [id number] Date of Birth: 1986/07/06 Referring Provider (OT): Poggi   Encounter Date: 05/21/2021   OT End of Session - 05/21/21 1501     Visit Number 4    Number of Visits 12    Date for OT Re-Evaluation 06/20/21    OT Start Time 1434    OT Stop Time 1520    OT Time Calculation (min) 46 min    Activity Tolerance Patient tolerated treatment well    Behavior During Therapy [facility] for tasks assessed/performed             Past Medical History:  Diagnosis Date   Anxiety    COVID-19 2021   Depression    Genital herpes    Obesity, Class III, BMI 40-49.9 (morbid obesity) (HCC)    PCOS (polycystic ovarian syndrome)    Prediabetes     Past Surgical History:  Procedure Laterality Date   GANGLION CYST EXCISION Right 03/14/2021   Procedure: EXCISION OF RIGHT VOLAR CARPAL GANGLION;  Surgeon: Christena Flake, MD;  Location: [facility] ORS;  Service: Orthopedics;  Laterality: Right;   KNEE SURGERY Left     There were no vitals filed for this visit.   Subjective Assessment - 05/21/21 1500     Subjective  Pain better and more motion - but still feel not right- stiff - always feels better after the heat - I don't know if I can do my job that I did prior to this    Pertinent History The patient is a 35 year old female with a several month history of progressive worsening radial sided volar right wrist pain. Her symptoms have persisted despite medications, activity modification, etc. Her history and examination are consistent with a volar carpal ganglion, confirmed by MRI scan. Pt is s/p excision of the right volar carpal ganglion on 03/14/2021.    Patient Stated Goals Pt would like to get back to using her right hand as she did before, return to work.     Currently in Pain? Yes    Pain Score 1     Pain Location --   discomfort   Pain Orientation Right    Pain Descriptors / Indicators Discomfort    Pain Type Surgical pain    Pain Onset More than a month ago    Pain Frequency Intermittent                [facility] OT Assessment - 05/21/21 0001       AROM   Right Forearm Pronation 90 Degrees    Right Forearm Supination 85 Degrees    Right Wrist Extension 62 Degrees    Right Wrist Flexion 85 Degrees    Right Wrist Radial Deviation 30 Degrees    Right Wrist Ulnar Deviation 35 Degrees    Left Wrist Extension --      Strength   Right Hand Grip (lbs) 34    Right Hand Lateral Pinch 18 lbs    Right Hand 3 Point Pinch 12 lbs    Left Hand Grip (lbs) 38    Left Hand Lateral Pinch 18 lbs    Left Hand 3 Point Pinch 14 lbs            Cont to make great progress in R wrist and hand - tenderness improving  greatly and scar adhesion  Still some numbness reported in proximal palm - but smaller area     Cont desensitization       OT Treatments/Exercises (OP) - 05/21/21 0001       RUE Fluidotherapy   Number Minutes Fluidotherapy 8 Minutes    RUE Fluidotherapy Location Hand;Wrist    Comments AROM for wrist in all planes prior to soft tissue            soft tissue mobs done to CT spreads and webspace and pt to have family assist 2 x day doing it after contrast  as needed    Full fisting on right, opposition to small finger and down to base of 5th without pain    Pt to cont contract for edema control,  Cont with  [employer] care scar pad to place over scar at night to promote healing of scar.     Pt instructed on scar massage over scar to be performed daily 2-3 times a day to decrease adhesions and to promote scar healing.- but wrist in flexion and extention     Pt instructed on home exercise program for AAROM of wrist  flexion, ext, RD, UD over edge of table  And prayer stretch  And wrist flexion over armrest  Was able to do  table slides today for wrist extention - 20 reps - 2 x day painfree  Review and add to HEP 1 lbs weight or 16oz hammer for wrist in all planes - no issues except fatigue with sup /pro  12-15 reps 2 x day Med teal putty for grip, lat and 3 point grip -12 -15 reps  2x day - pain free  Can increase to 2 nd set if no issues in 2 days           OT Education - 05/21/21 1501     Education Details progress and changes to HEP    Person(s) Educated Patient    Methods Explanation;Demonstration;Handout    Comprehension Verbalized understanding;Need further instruction                 OT Long Term Goals - 05/14/21 0938       OT LONG TERM GOAL #1   Title Pt will be independent with home exercise program.    Baseline no current program    Time 6    Period Weeks    Status New    Target Date 06/20/21      OT LONG TERM GOAL #2   Title Pt will improve FOTO score to 71 or above to demonstrate a clinically relevant change to impact greater independence in necessary daily tasks.    Baseline 62 at eval    Time 6    Period Weeks    Status New    Target Date 06/20/21      OT LONG TERM GOAL #3   Title Pt will demonstrate understanding of use of contrast to decrease edema.    Baseline no current knowledge    Time 2    Period Weeks    Status New    Target Date 05/21/21      OT LONG TERM GOAL #4   Title Pt will demonstrate improvement in wrist motion by 15 degrees to perform self care tasks with modified independence.    Baseline see flowsheet for details    Time 6    Period Weeks    Status New    Target Date 06/20/21  OT LONG TERM GOAL #5   Title Pt will demonstrate improvement in right supination by 10 degrees to assist with IADL tasks.    Baseline 70 degrees    Time 6    Period Weeks    Status New    Target Date 06/20/21                   Plan - 05/21/21 1502     Clinical Impression Statement Pt is a 35 yo female who is s/p excision of right volar  carpal ganglion on 03/14/2021 (10 weeks) who was referred for hand therapy OT evaluation. Pt this date show increase AROM in wrist flexion and extention after reinforcing scar mobs and desentitzation last 2 visits- supination this date WNL - and scar adhesion doing better and sensitivty. Digits AROM WNL and oppostion - Pt report more discomfort than pain - grip increase again this date - more than L hand now - prehension too. Initiate this date strengthening 1 lbs in all planes and can icnrease to 2nd set in 2 days if not issue- Pt fatigue with sup/pro more than other planes and gripping of putty . Pt fearfull to return to prior job -because of repetition and discomfort with ROM at wrist and hand still and decrease strength- cont with some tenderness, decreased ROM end range, decreased strength and decreased ability to perform select daily self care/IADL tasks including work related tasks.  She would benefit from skilled OT services to maximize her safety and independence with necessary daily tasks.    OT Occupational Profile and History Detailed Assessment- Review of Records and additional review of physical, cognitive, psychosocial history related to current functional performance    Occupational performance deficits (Please refer to evaluation for details): IADL's;ADL's;Work    Body Structure / Function / Physical Skills ADL;Dexterity;Flexibility;ROM;Strength;Coordination;FMC;IADL;Scar mobility;Pain;Sensation;UE functional use    Psychosocial Skills Environmental  Adaptations;Habits;Routines and Behaviors    Rehab Potential Excellent    Clinical Decision Making Limited treatment options, no task modification necessary    Comorbidities Affecting Occupational Performance: May have comorbidities impacting occupational performance    Modification or Assistance to Complete Evaluation  No modification of tasks or assist necessary to complete eval    OT Frequency 2x / week    OT Duration 6 weeks    OT  Treatment/Interventions Self-care/ADL training;Cryotherapy;Paraffin;Therapeutic exercise;DME and/or AE instruction;Ultrasound;Fluidtherapy;Neuromuscular education;Manual Therapy;Scar mobilization;Moist Heat;Contrast Bath;Passive range of motion;Therapeutic activities;Patient/family education    Consulted and Agree with Plan of Care Patient             Patient will benefit from skilled therapeutic intervention in order to improve the following deficits and impairments:   Body Structure / Function / Physical Skills: ADL, Dexterity, Flexibility, ROM, Strength, Coordination, FMC, IADL, Scar mobility, Pain, Sensation, UE functional use   Psychosocial Skills: Environmental  Adaptations, Habits, Routines and Behaviors   Visit Diagnosis: Pain in right wrist  Muscle weakness (generalized)  Stiffness of right wrist, not elsewhere classified    Problem List Patient Active Problem List   Diagnosis Date Noted   PCOS (polycystic ovarian syndrome)    Prediabetes    Obesity, Class III, BMI 40-49.9 (morbid obesity) (HCC)     Keturah Yerby OTR/L,CLT 05/21/2021, 8:05 PM  [place] [address] [place], [address] Phone: [phone]   Fax:  [phone]  Name: Amber Keller MRN: [id number] Date of Birth: 27-May-1986

## 2021-05-24 ENCOUNTER — Ambulatory Visit: Payer: Commercial Managed Care - PPO | Admitting: Occupational Therapy

## 2021-05-24 ENCOUNTER — Encounter: Payer: Self-pay | Admitting: Occupational Therapy

## 2021-05-24 DIAGNOSIS — M6281 Muscle weakness (generalized): Secondary | ICD-10-CM

## 2021-05-24 DIAGNOSIS — M25631 Stiffness of right wrist, not elsewhere classified: Secondary | ICD-10-CM

## 2021-05-24 DIAGNOSIS — M25531 Pain in right wrist: Secondary | ICD-10-CM

## 2021-05-24 NOTE — Therapy (Signed)
[place] [facility] [address], [place], [address] Phone: [phone]   Fax:  [phone]  Occupational Therapy Treatment  Patient Details  Name: Amber Keller MRN: [id number] Date of Birth: 1986/06/30 Referring Provider (OT): Poggi   Encounter Date: 05/24/2021   OT End of Session - 05/27/21 1541     Visit Number 5    Number of Visits 12    Date for OT Re-Evaluation 06/20/21    OT Start Time 1408    OT Stop Time 1500    OT Time Calculation (min) 52 min    Activity Tolerance Patient tolerated treatment well    Behavior During Therapy [facility] for tasks assessed/performed             Past Medical History:  Diagnosis Date   Anxiety    COVID-19 2021   Depression    Genital herpes    Obesity, Class III, BMI 40-49.9 (morbid obesity) (HCC)    PCOS (polycystic ovarian syndrome)    Prediabetes     Past Surgical History:  Procedure Laterality Date   GANGLION CYST EXCISION Right 03/14/2021   Procedure: EXCISION OF RIGHT VOLAR CARPAL GANGLION;  Surgeon: Christena Flake, MD;  Location: [facility] ORS;  Service: Orthopedics;  Laterality: Right;   KNEE SURGERY Left     There were no vitals filed for this visit.   Subjective Assessment - 05/27/21 1540     Subjective  Pt reports she has made progress just slower than she would like.  Tried to use the weedeater for  short time yesterday and using the buttons aggravated her hand.    Pertinent History The patient is a 35 year old female with a several month history of progressive worsening radial sided volar right wrist pain. Her symptoms have persisted despite medications, activity modification, etc. Her history and examination are consistent with a volar carpal ganglion, confirmed by MRI scan. Pt is s/p excision of the right volar carpal ganglion on 03/14/2021.    Patient Stated Goals Pt would like to get back to using her right hand as she did before, return to work.     Currently in Pain? Yes    Pain Score 1     Pain Location Hand    Pain Orientation Right    Pain Descriptors / Indicators Aching    Pain Onset More than a month ago    Pain Frequency Intermittent    Multiple Pain Sites No                [facility] OT Assessment - 05/27/21 1602       Assessment   Medical Diagnosis Right ganglion cyst s/p surgery    Referring Provider (OT) Poggi    Hand Dominance Right      AROM   Right Forearm Pronation 90 Degrees    Right Forearm Supination 90 Degrees    Left Forearm Pronation 90 Degrees    Left Forearm Supination 90 Degrees    Right Wrist Extension 65 Degrees    Right Wrist Flexion 85 Degrees    Right Wrist Radial Deviation 30 Degrees    Right Wrist Ulnar Deviation 35 Degrees      Strength   Right Hand Grip (lbs) 34    Left Hand Grip (lbs) 38             Fluidotherapy for 8 mins to right wrist and hand, AROM for wrist and hand in all planes of motion prior  to manual therapy.    Manual therapy:   Soft tissue mobilization to right hand and wrist  for webspace and carpal spreads.  Scar massage to right UE along with desensitization techniques, patient tolerating well compared to last sessions, still has some areas of numbness in right palm but continues to improve.    Therapeutic Exercises: Pt able to demonstrate full fisting on the right along with full opposition of thumb to all digits.   AAROM of wrist  flexion, ext, RD, UD over edge of table  And prayer stretch  Wrist flexion over armrest  Table slides for wrist extension - 20 reps - 2 x day painfree  1 lbs weight or 16oz hammer for wrist in all planes - no issues except fatigue with sup /pro.  Improved AROM for supination with measurements this date.  12-15 reps 2 x day Pt to continue with Med teal putty for grip, lat and 3 point grip -12 -15 reps, she did not bring putty to therapy today but therapist reviewed exercises and rec pt increase to 2nd set in the next 2  days.  Response to tx:  Pt continues to make good progress in all areas, decreased pain overall, improved sensation, decreased tenderness and improved ROM.  She is able to complete exercises with minimal cues for proper form and technique.  Able to demonstrate use of 1# weight/hammer for supination and now has full motion with measurements today.  She did try to use weedeater this week which aggravated her symptoms, discussed modification of tasks and/or having someone else complete these heavier tasks during her recovery.  Continue to work towards goals in plan of care to improve functional ROM, use for daily tasks.                  OT Education - 05/27/21 1540     Education Details progress and changes to HEP    Person(s) Educated Patient    Methods Explanation;Demonstration;Handout    Comprehension Verbalized understanding;Need further instruction                 OT Long Term Goals - 05/14/21 2800       OT LONG TERM GOAL #1   Title Pt will be independent with home exercise program.    Baseline no current program    Time 6    Period Weeks    Status New    Target Date 06/20/21      OT LONG TERM GOAL #2   Title Pt will improve FOTO score to 71 or above to demonstrate a clinically relevant change to impact greater independence in necessary daily tasks.    Baseline 62 at eval    Time 6    Period Weeks    Status New    Target Date 06/20/21      OT LONG TERM GOAL #3   Title Pt will demonstrate understanding of use of contrast to decrease edema.    Baseline no current knowledge    Time 2    Period Weeks    Status New    Target Date 05/21/21      OT LONG TERM GOAL #4   Title Pt will demonstrate improvement in wrist motion by 15 degrees to perform self care tasks with modified independence.    Baseline see flowsheet for details    Time 6    Period Weeks    Status New    Target Date 06/20/21  OT LONG TERM GOAL #5   Title Pt will demonstrate  improvement in right supination by 10 degrees to assist with IADL tasks.    Baseline 70 degrees    Time 6    Period Weeks    Status New    Target Date 06/20/21                   Plan - 05/27/21 1542     Clinical Impression Statement Pt is a 35 yo female who is s/p excision of right volar carpal ganglion on 03/14/2021.  Pt continues to make good progress in all areas, decreased pain overall, improved sensation, decreased tenderness and improved ROM.  She is able to complete exercises with minimal cues for proper form and technique.  Able to demonstrate use of 1# weight/hammer for supination and now has full motion with measurements today.  She did try to use weedeater this week which aggravated her symptoms, discussed modification of tasks and/or having someone else complete these heavier tasks during her recovery.  Continue to work towards goals in plan of care to improve functional ROM, use for daily tasks.    OT Occupational Profile and History Detailed Assessment- Review of Records and additional review of physical, cognitive, psychosocial history related to current functional performance    Occupational performance deficits (Please refer to evaluation for details): IADL's;ADL's;Work    Body Structure / Function / Physical Skills ADL;Dexterity;Flexibility;ROM;Strength;Coordination;FMC;IADL;Scar mobility;Pain;Sensation;UE functional use    Psychosocial Skills Environmental  Adaptations;Habits;Routines and Behaviors    Rehab Potential Excellent    Clinical Decision Making Limited treatment options, no task modification necessary    Comorbidities Affecting Occupational Performance: May have comorbidities impacting occupational performance    Modification or Assistance to Complete Evaluation  No modification of tasks or assist necessary to complete eval    OT Frequency 2x / week    OT Duration 6 weeks    OT Treatment/Interventions Self-care/ADL training;Cryotherapy;Paraffin;Therapeutic  exercise;DME and/or AE instruction;Ultrasound;Fluidtherapy;Neuromuscular education;Manual Therapy;Scar mobilization;Moist Heat;Contrast Bath;Passive range of motion;Therapeutic activities;Patient/family education    Consulted and Agree with Plan of Care Patient             Patient will benefit from skilled therapeutic intervention in order to improve the following deficits and impairments:   Body Structure / Function / Physical Skills: ADL, Dexterity, Flexibility, ROM, Strength, Coordination, FMC, IADL, Scar mobility, Pain, Sensation, UE functional use   Psychosocial Skills: Environmental  Adaptations, Habits, Routines and Behaviors   Visit Diagnosis: Pain in right wrist  Muscle weakness (generalized)  Stiffness of right wrist, not elsewhere classified    Problem List Patient Active Problem List   Diagnosis Date Noted   PCOS (polycystic ovarian syndrome)    Prediabetes    Obesity, Class III, BMI 40-49.9 (morbid obesity) (HCC)    Estefana Taylor T Razi Hickle, OTR/L, CLT  Danelly Hassinger 05/28/2021, 4:31 PM  [place] [place] [facility] [address] [place], [address] Phone: [phone]   Fax:  [phone]  Name: Dazhane Villagomez MRN: [id number] Date of Birth: 12-30-1985

## 2021-05-29 ENCOUNTER — Ambulatory Visit: Payer: Commercial Managed Care - PPO | Admitting: Occupational Therapy

## 2021-05-29 DIAGNOSIS — M25631 Stiffness of right wrist, not elsewhere classified: Secondary | ICD-10-CM

## 2021-05-29 DIAGNOSIS — M6281 Muscle weakness (generalized): Secondary | ICD-10-CM | POA: Diagnosis not present

## 2021-05-29 DIAGNOSIS — M25531 Pain in right wrist: Secondary | ICD-10-CM

## 2021-05-29 NOTE — Therapy (Signed)
[place] [facility] [address], [place], [address] Phone: [phone]   Fax:  [phone]  Occupational Therapy Treatment  Patient Details  Name: Amber Keller MRN: [id number] Date of Birth: 1986-03-03 Referring Provider (OT): Poggi   Encounter Date: 05/29/2021   OT End of Session - 05/29/21 1609     Visit Number 6    Number of Visits 12    Date for OT Re-Evaluation 06/20/21    OT Start Time 1453    OT Stop Time 1537    OT Time Calculation (min) 44 min    Activity Tolerance Patient tolerated treatment well    Behavior During Therapy [facility] for tasks assessed/performed             Past Medical History:  Diagnosis Date   Anxiety    COVID-19 2021   Depression    Genital herpes    Obesity, Class III, BMI 40-49.9 (morbid obesity) (HCC)    PCOS (polycystic ovarian syndrome)    Prediabetes     Past Surgical History:  Procedure Laterality Date   GANGLION CYST EXCISION Right 03/14/2021   Procedure: EXCISION OF RIGHT VOLAR CARPAL GANGLION;  Surgeon: Christena Flake, MD;  Location: [facility] ORS;  Service: Orthopedics;  Laterality: Right;   KNEE SURGERY Left     There were no vitals filed for this visit.   Subjective Assessment - 05/29/21 1607     Subjective  I seen Dr Joice Lofts - light duty and gradually increase hours - but I don't know if work is going to allow it - Did Amy tell you I used the weedeather and payed for it last time - but better    Pertinent History The patient is a 35 year old female with a several month history of progressive worsening radial sided volar right wrist pain. Her symptoms have persisted despite medications, activity modification, etc. Her history and examination are consistent with a volar carpal ganglion, confirmed by MRI scan. Pt is s/p excision of the right volar carpal ganglion on 03/14/2021.    Patient Stated Goals Pt would like to get back to using her right hand as she did  before, return to work.    Currently in Pain? No/denies                [facility] OT Assessment - 05/29/21 0001       AROM   Right Forearm Pronation 90 Degrees    Right Forearm Supination 90 Degrees    Left Forearm Pronation 90 Degrees    Left Forearm Supination 90 Degrees    Right Wrist Extension 65 Degrees    Right Wrist Flexion 90 Degrees    Right Wrist Radial Deviation 30 Degrees    Right Wrist Ulnar Deviation 35 Degrees      Strength   Right Hand Grip (lbs) 39    Right Hand Lateral Pinch 20 lbs    Right Hand 3 Point Pinch 13 lbs    Left Hand Grip (lbs) 50    Left Hand Lateral Pinch 21 lbs    Left Hand 3 Point Pinch 14 lbs              Grip and prehension increase - see flow sheet Wrist extention same- and had some pain with composite extention with digits hyper extention- Med N pain  Still some numbness in palm  Table slide doing well and add wall slides 20 reps And then Composite nerve glide on  wall -5 reps - stepping away         OT Treatments/Exercises (OP) - 05/29/21 0001       RUE Contrast Bath   Time 8 minutes    Comments decrease stiffness , prior to ROM             soft tissue mobs done to CT spreads and webspace and pt to have family assist 2 x day doing it after contrast  as needed Tape again scar - to facilitate scar mobs during motion     Full fisting on right, opposition to small finger and down to base of 5th without pain - and good strength   Pt to cont contract for edema control,  Cont with  [facility] care scar pad to place over scar at night to promote healing of scar.      Pt instructed on scar massage over scar to be performed daily 2-3 times a day to decrease adhesions and to promote scar healing.- but wrist in extention     Pt instructed on home exercise program for AAROM of wrist  flexion, ext, RD, UD over edge of table  And prayer stretch  2 lbs weight  wrist in all planes - no issues  12-15 reps 2 x day 1 set - 2 x  day Med teal putty for grip, lat and 3 point grip -12 -15 reps  2x day - pain free - 3 sets          OT Education - 05/29/21 1609     Education Details progress and changes to HEP    Person(s) Educated Patient    Methods Explanation;Demonstration;Handout    Comprehension Verbalized understanding;Need further instruction                 OT Long Term Goals - 05/14/21 0938       OT LONG TERM GOAL #1   Title Pt will be independent with home exercise program.    Baseline no current program    Time 6    Period Weeks    Status New    Target Date 06/20/21      OT LONG TERM GOAL #2   Title Pt will improve FOTO score to 71 or above to demonstrate a clinically relevant change to impact greater independence in necessary daily tasks.    Baseline 62 at eval    Time 6    Period Weeks    Status New    Target Date 06/20/21      OT LONG TERM GOAL #3   Title Pt will demonstrate understanding of use of contrast to decrease edema.    Baseline no current knowledge    Time 2    Period Weeks    Status New    Target Date 05/21/21      OT LONG TERM GOAL #4   Title Pt will demonstrate improvement in wrist motion by 15 degrees to perform self care tasks with modified independence.    Baseline see flowsheet for details    Time 6    Period Weeks    Status New    Target Date 06/20/21      OT LONG TERM GOAL #5   Title Pt will demonstrate improvement in right supination by 10 degrees to assist with IADL tasks.    Baseline 70 degrees    Time 6    Period Weeks    Status New    Target Date 06/20/21  Plan - 05/29/21 1609     Clinical Impression Statement Pt is a 35 yo female who is s/p excision of right volar carpal ganglion on 03/14/2021.  Pt continues to make good progress in all areas, decreased pain overall, improved sensation, decreased tenderness and improved ROM. Was able to increase to 2 lbs for wrist in all planes and cont with same putty but 3 sets  - Grip and prehension increase -still limited in composite extention and Med N - cont scar massage with wrist in extention .  Continue to work towards goals in plan of care to improve functional strength to return to work    OT Occupational Profile and History Detailed Assessment- Review of Records and additional review of physical, cognitive, psychosocial history related to current functional performance    Occupational performance deficits (Please refer to evaluation for details): IADL's;ADL's;Work    Body Structure / Function / Physical Skills ADL;Dexterity;Flexibility;ROM;Strength;Coordination;FMC;IADL;Scar mobility;Pain;Sensation;UE functional use    Psychosocial Skills Environmental  Adaptations;Habits;Routines and Behaviors    Rehab Potential Excellent    Clinical Decision Making Limited treatment options, no task modification necessary    Comorbidities Affecting Occupational Performance: May have comorbidities impacting occupational performance    Modification or Assistance to Complete Evaluation  No modification of tasks or assist necessary to complete eval    OT Frequency 2x / week    OT Duration 6 weeks    OT Treatment/Interventions Self-care/ADL training;Cryotherapy;Paraffin;Therapeutic exercise;DME and/or AE instruction;Ultrasound;Fluidtherapy;Neuromuscular education;Manual Therapy;Scar mobilization;Moist Heat;Contrast Bath;Passive range of motion;Therapeutic activities;Patient/family education    Consulted and Agree with Plan of Care Patient             Patient will benefit from skilled therapeutic intervention in order to improve the following deficits and impairments:   Body Structure / Function / Physical Skills: ADL, Dexterity, Flexibility, ROM, Strength, Coordination, FMC, IADL, Scar mobility, Pain, Sensation, UE functional use   Psychosocial Skills: Environmental  Adaptations, Habits, Routines and Behaviors   Visit Diagnosis: Pain in right wrist  Muscle weakness  (generalized)  Stiffness of right wrist, not elsewhere classified    Problem List Patient Active Problem List   Diagnosis Date Noted   PCOS (polycystic ovarian syndrome)    Prediabetes    Obesity, Class III, BMI 40-49.9 (morbid obesity) (HCC)     Shaasia Odle OTR/L,CLT 05/29/2021, 4:22 PM  [place] [facility] [address], [place], [address] Phone: [phone]   Fax:  [phone]  Name: Amber Keller MRN: [id number] Date of Birth: [birth year]

## 2021-05-31 ENCOUNTER — Ambulatory Visit: Payer: Commercial Managed Care - PPO | Admitting: Occupational Therapy

## 2021-05-31 ENCOUNTER — Other Ambulatory Visit: Payer: Self-pay

## 2021-05-31 DIAGNOSIS — M25631 Stiffness of right wrist, not elsewhere classified: Secondary | ICD-10-CM

## 2021-05-31 DIAGNOSIS — M25531 Pain in right wrist: Secondary | ICD-10-CM

## 2021-05-31 DIAGNOSIS — M6281 Muscle weakness (generalized): Secondary | ICD-10-CM

## 2021-05-31 NOTE — Therapy (Signed)
[facility] [address], [place], [address] Phone: [phone]   Fax:  [phone]  Occupational Therapy Treatment  Patient Details  Name: Amber Keller MRN: [id number] Date of Birth: [birth year] Referring Provider (OT): Poggi   Encounter Date: 05/31/2021   OT End of Session - 05/31/21 1606     Visit Number 7    Number of Visits 12    Date for OT Re-Evaluation 06/20/21    OT Start Time 1444    OT Stop Time 1531    OT Time Calculation (min) 47 min    Activity Tolerance Patient tolerated treatment well    Behavior During Therapy [facility] for tasks assessed/performed             Past Medical History:  Diagnosis Date   Anxiety    COVID-19 2021   Depression    Genital herpes    Obesity, Class III, BMI 40-49.9 (morbid obesity) (HCC)    PCOS (polycystic ovarian syndrome)    Prediabetes     Past Surgical History:  Procedure Laterality Date   GANGLION CYST EXCISION Right 03/14/2021   Procedure: EXCISION OF RIGHT VOLAR CARPAL GANGLION;  Surgeon: Christena Flake, MD;  Location: [facility] ORS;  Service: Orthopedics;  Laterality: Right;   KNEE SURGERY Left     There were no vitals filed for this visit.   Subjective Assessment - 05/31/21 1604     Subjective  Doing better - I can go back to work 4 hrs for week, then 6th and then 8 - don't know what I am going to do - getting stronger , I can tell difference    Pertinent History The patient is a 35 year old female with a several month history of progressive worsening radial sided volar right wrist pain. Her symptoms have persisted despite medications, activity modification, etc. Her history and examination are consistent with a volar carpal ganglion, confirmed by MRI scan. Pt is s/p excision of the right volar carpal ganglion on 03/14/2021.    Patient Stated Goals Pt would like to get back to using her right hand as she did before, return to work.    Currently in  Pain? No/denies                          OT Treatments/Exercises (OP) - 05/31/21 0001       RUE Fluidotherapy   Number Minutes Fluidotherapy 8 Minutes    RUE Fluidotherapy Location Hand;Wrist    Comments AROM for wrist in all planes              soft tissue mobs done to CT spreads and webspace     Full fisting on right, opposition to small finger and down to base of 5th without pain - and good strength   Pt to cont contract for edema control,  Cont with  C[employer] care scar pad to place over scar at night to promote healing of scar.      Pt instructed on scar massage over scar to be performed daily 2-3 times a day to decrease adhesions and to promote scar healing.- but wrist in extention     Pt instructed on home exercise program for AAROM of wrist  flexion, ext, RD, UD over edge of table  And prayer stretch  2 lbs weight  wrist in all planes - no issues  12-15 reps 2 x day 2 sets- 2 x day  Med green putty for grip, lat and 3 point grip -12 -15 reps  1 sets of 12 reps   2 x day Increase in 3 days another set - if pain free       OT Education - 05/31/21 1606     Education Details progress and changes to HEP    Person(s) Educated Patient    Methods Explanation;Demonstration;Handout    Comprehension Verbalized understanding;Need further instruction                 OT Long Term Goals - 05/14/21 0938       OT LONG TERM GOAL #1   Title Pt will be independent with home exercise program.    Baseline no current program    Time 6    Period Weeks    Status New    Target Date 06/20/21      OT LONG TERM GOAL #2   Title Pt will improve FOTO score to 71 or above to demonstrate a clinically relevant change to impact greater independence in necessary daily tasks.    Baseline 62 at eval    Time 6    Period Weeks    Status New    Target Date 06/20/21      OT LONG TERM GOAL #3   Title Pt will demonstrate understanding of use of contrast to decrease  edema.    Baseline no current knowledge    Time 2    Period Weeks    Status New    Target Date 05/21/21      OT LONG TERM GOAL #4   Title Pt will demonstrate improvement in wrist motion by 15 degrees to perform self care tasks with modified independence.    Baseline see flowsheet for details    Time 6    Period Weeks    Status New    Target Date 06/20/21      OT LONG TERM GOAL #5   Title Pt will demonstrate improvement in right supination by 10 degrees to assist with IADL tasks.    Baseline 70 degrees    Time 6    Period Weeks    Status New    Target Date 06/20/21                   Plan - 05/31/21 1607     Clinical Impression Statement Pt is a 35 yo female who is s/p excision of right volar carpal ganglion on 03/14/2021.  Pt continues to make good progress in all areas, decreased pain overall, improved sensation, decreased tenderness and improved ROM. Was able to increase to 2 lbs for wrist in all planes  - can icnrease to 2 sets - did increase putty resistance  -still limited in composite extention and Med N - cont scar massage with wrist in extention .  Continue to work towards goals in plan of care to improve functional strength to return to work    OT Occupational Profile and History Detailed Assessment- Review of Records and additional review of physical, cognitive, psychosocial history related to current functional performance    Occupational performance deficits (Please refer to evaluation for details): IADL's;ADL's;Work    Body Structure / Function / Physical Skills ADL;Dexterity;Flexibility;ROM;Strength;Coordination;FMC;IADL;Scar mobility;Pain;Sensation;UE functional use    Psychosocial Skills Environmental  Adaptations;Habits;Routines and Behaviors    Rehab Potential Excellent    Clinical Decision Making Limited treatment options, no task modification necessary    Comorbidities Affecting Occupational Performance: May have comorbidities impacting occupational  performance    Modification or Assistance to Complete Evaluation  No modification of tasks or assist necessary to complete eval    OT Frequency 2x / week    OT Duration 6 weeks    OT Treatment/Interventions Self-care/ADL training;Cryotherapy;Paraffin;Therapeutic exercise;DME and/or AE instruction;Ultrasound;Fluidtherapy;Neuromuscular education;Manual Therapy;Scar mobilization;Moist Heat;Contrast Bath;Passive range of motion;Therapeutic activities;Patient/family education    Consulted and Agree with Plan of Care Patient             Patient will benefit from skilled therapeutic intervention in order to improve the following deficits and impairments:   Body Structure / Function / Physical Skills: ADL, Dexterity, Flexibility, ROM, Strength, Coordination, FMC, IADL, Scar mobility, Pain, Sensation, UE functional use   Psychosocial Skills: Environmental  Adaptations, Habits, Routines and Behaviors   Visit Diagnosis: Muscle weakness (generalized)  Stiffness of right wrist, not elsewhere classified  Pain in right wrist    Problem List Patient Active Problem List   Diagnosis Date Noted   PCOS (polycystic ovarian syndrome)    Prediabetes    Obesity, Class III, BMI 40-49.9 (morbid obesity) (HCC)     Diksha Tagliaferro OTR/L,CLT 05/31/2021, 4:09 PM  [facility] [address] [place], [address] Phone: [phone]   Fax:  [phone]  Name: Amber Keller MRN: [id number] Date of Birth: 02/22/1986

## 2021-06-05 ENCOUNTER — Ambulatory Visit: Payer: Commercial Managed Care - PPO | Admitting: Occupational Therapy

## 2021-06-05 DIAGNOSIS — M6281 Muscle weakness (generalized): Secondary | ICD-10-CM

## 2021-06-05 DIAGNOSIS — M25631 Stiffness of right wrist, not elsewhere classified: Secondary | ICD-10-CM

## 2021-06-05 DIAGNOSIS — M25531 Pain in right wrist: Secondary | ICD-10-CM

## 2021-06-05 NOTE — Therapy (Signed)
[facility] [address] [place], [address] Phone: [phone]   Fax:  [phone]  Occupational Therapy Treatment  Patient Details  Name: Amber Keller MRN: [id number] Date of Birth: December 20, 1985 Referring Provider (OT): Poggi   Encounter Date: 06/05/2021   OT End of Session - 06/05/21 1422     Visit Number 8    Number of Visits 12    Date for OT Re-Evaluation 06/20/21    OT Start Time 1400    OT Stop Time 1444    OT Time Calculation (min) 44 min    Activity Tolerance Patient tolerated treatment well             Past Medical History:  Diagnosis Date   Anxiety    COVID-19 2021   Depression    Genital herpes    Obesity, Class III, BMI 40-49.9 (morbid obesity) (HCC)    PCOS (polycystic ovarian syndrome)    Prediabetes     Past Surgical History:  Procedure Laterality Date   GANGLION CYST EXCISION Right 03/14/2021   Procedure: EXCISION OF RIGHT VOLAR CARPAL GANGLION;  Surgeon: Christena Flake, MD;  Location: [facility];  Service: Orthopedics;  Laterality: Right;   KNEE SURGERY Left     There were no vitals filed for this visit.   Subjective Assessment - 06/05/21 1418     Subjective  I am stronger - using it more and can carry 1/2 gallon of water - push up and open and close doors - driving - and starting new job with [employer]  middle Sept    Pertinent History The patient is a 35 year old female with a several month history of progressive worsening radial sided volar right wrist pain. Her symptoms have persisted despite medications, activity modification, etc. Her history and examination are consistent with a volar carpal ganglion, confirmed by MRI scan. Pt is s/p excision of the right volar carpal ganglion on 03/14/2021.    Patient Stated Goals Pt would like to get back to using her right hand as she did before, return to work.    Currently in Pain? No/denies                [facility] OT  Assessment - 06/05/21 0001       AROM   Right Forearm Pronation 90 Degrees    Right Forearm Supination 90 Degrees    Left Forearm Pronation 90 Degrees    Left Forearm Supination 90 Degrees    Right Wrist Extension 70 Degrees    Right Wrist Flexion 85 Degrees    Right Wrist Radial Deviation 30 Degrees    Right Wrist Ulnar Deviation 35 Degrees      Strength   Right Hand Grip (lbs) 39    Right Hand Lateral Pinch 21 lbs    Right Hand 3 Point Pinch 16 lbs    Left Hand Grip (lbs) 50    Left Hand Lateral Pinch 21 lbs    Left Hand 3 Point Pinch 16 lbs              Grip same but prehension increase - no pain with AROM in all planes for wrist  Could push and pull heavy door - push up from chair -but pain in palm with wall pushup  Could carry and lift 8 lbs without issues - pull with 10 lbs at wrist         OT Treatments/Exercises (OP) - 06/05/21 0001  RUE Fluidotherapy   Number Minutes Fluidotherapy 8 Minutes    RUE Fluidotherapy Location Hand;Wrist    Comments AROM for wrist in all planes - dcrease stiffness             soft tissue mobs done to CT spreads and webspace  Scar massage and xtractor done with fisting andthumb RA  Sting and burn felt - kinesiotape done over distal scar - star to wear for 12-24 hrs     Full fisting on right, opposition to small finger and down to base of 5th without pain - and good strength   Pt to cont contract for edema control,  Cont with  [employer] care scar pad to place over scar at night to promote healing of scar.      Pt instructed on scar massage over scar to be performed daily 2-3 times a day to decrease adhesions and to promote scar healing.- but wrist in extention     Pt instructed on home exercise program for AAROM of wrist  flexion, ext, RD, UD over edge of table  And prayer stretch  2 lbs weight  wrist in all planes - no issues  12-15 reps 2 x day 3 sets- 2 x day Med firm green putty for grip, lat and 3 point grip  -12 -15 reps  1 sets of 12 reps   2 x day Keep pain free        OT Education - 06/05/21 1422     Education Details progress and changes to HEP    Person(s) Educated Patient    Methods Explanation;Demonstration;Handout    Comprehension Verbalized understanding;Need further instruction                 OT Long Term Goals - 05/14/21 0938       OT LONG TERM GOAL #1   Title Pt will be independent with home exercise program.    Baseline no current program    Time 6    Period Weeks    Status New    Target Date 06/20/21      OT LONG TERM GOAL #2   Title Pt will improve FOTO score to 71 or above to demonstrate a clinically relevant change to impact greater independence in necessary daily tasks.    Baseline 62 at eval    Time 6    Period Weeks    Status New    Target Date 06/20/21      OT LONG TERM GOAL #3   Title Pt will demonstrate understanding of use of contrast to decrease edema.    Baseline no current knowledge    Time 2    Period Weeks    Status New    Target Date 05/21/21      OT LONG TERM GOAL #4   Title Pt will demonstrate improvement in wrist motion by 15 degrees to perform self care tasks with modified independence.    Baseline see flowsheet for details    Time 6    Period Weeks    Status New    Target Date 06/20/21      OT LONG TERM GOAL #5   Title Pt will demonstrate improvement in right supination by 10 degrees to assist with IADL tasks.    Baseline 70 degrees    Time 6    Period Weeks    Status New    Target Date 06/20/21  Plan - 06/05/21 1422     Clinical Impression Statement Pt is a 35 yo female who is s/p excision of right volar carpal ganglion on 03/14/2021.  Pt continues to make good progress in all areas, decreased pain overall, improved sensation, decreased tenderness and improved ROM.  Still small area of numbness over thenar eminence -and distal scar tissue still adhere and tender- unable to do comfortable  push up on wall - was able to push, pull heavy door, carry 8 lbs with ease and lift -  increase putty resistance  - cont scar massage with wrist in extention .  Continue to work towards goals in plan of care to improve functional strength to return to work    OT Occupational Profile and History Detailed Assessment- Review of Records and additional review of physical, cognitive, psychosocial history related to current functional performance    Occupational performance deficits (Please refer to evaluation for details): IADL's;ADL's;Work    Body Structure / Function / Physical Skills ADL;Dexterity;Flexibility;ROM;Strength;Coordination;FMC;IADL;Scar mobility;Pain;Sensation;UE functional use    Psychosocial Skills Environmental  Adaptations;Habits;Routines and Behaviors    Rehab Potential Excellent    Clinical Decision Making Limited treatment options, no task modification necessary    Comorbidities Affecting Occupational Performance: May have comorbidities impacting occupational performance    Modification or Assistance to Complete Evaluation  No modification of tasks or assist necessary to complete eval    OT Frequency 2x / week    OT Duration 6 weeks    OT Treatment/Interventions Self-care/ADL training;Cryotherapy;Paraffin;Therapeutic exercise;DME and/or AE instruction;Ultrasound;Fluidtherapy;Neuromuscular education;Manual Therapy;Scar mobilization;Moist Heat;Contrast Bath;Passive range of motion;Therapeutic activities;Patient/family education    Consulted and Agree with Plan of Care Patient             Patient will benefit from skilled therapeutic intervention in order to improve the following deficits and impairments:   Body Structure / Function / Physical Skills: ADL, Dexterity, Flexibility, ROM, Strength, Coordination, FMC, IADL, Scar mobility, Pain, Sensation, UE functional use   Psychosocial Skills: Environmental  Adaptations, Habits, Routines and Behaviors   Visit Diagnosis: Stiffness  of right wrist, not elsewhere classified  Pain in right wrist  Muscle weakness (generalized)    Problem List Patient Active Problem List   Diagnosis Date Noted   PCOS (polycystic ovarian syndrome)    Prediabetes    Obesity, Class III, BMI 40-49.9 (morbid obesity) (HCC)     Sarayu Prevost OTR/L,CLT 06/05/2021, 4:55 PM   [facility] [address] [place], [address] Phone: [phone]   Fax:  [phone]  Name: Amber Keller MRN: [id number] Date of Birth: 1985/11/20

## 2021-06-07 ENCOUNTER — Ambulatory Visit: Payer: Commercial Managed Care - PPO | Attending: Surgery | Admitting: Occupational Therapy

## 2021-06-07 DIAGNOSIS — M25631 Stiffness of right wrist, not elsewhere classified: Secondary | ICD-10-CM | POA: Insufficient documentation

## 2021-06-07 DIAGNOSIS — M25531 Pain in right wrist: Secondary | ICD-10-CM | POA: Insufficient documentation

## 2021-06-07 DIAGNOSIS — M6281 Muscle weakness (generalized): Secondary | ICD-10-CM | POA: Insufficient documentation

## 2021-06-07 NOTE — Therapy (Signed)
[place] [facility] [address], [place], [address] Phone: [phone]   Fax:  [phone]  Occupational Therapy Treatment  Patient Details  Name: Amber Keller MRN: [id number] Date of Birth: [birth year] Referring Provider (OT): Poggi   Encounter Date: 06/07/2021   OT End of Session - 06/07/21 1527     Visit Number 9    Number of Visits 12    Date for OT Re-Evaluation 06/20/21    OT Start Time 1447    OT Stop Time 1526    OT Time Calculation (min) 39 min    Activity Tolerance Patient tolerated treatment well    Behavior During Therapy [facility] for tasks assessed/performed             Past Medical History:  Diagnosis Date   Anxiety    COVID-19 2021   Depression    Genital herpes    Obesity, Class III, BMI 40-49.9 (morbid obesity) (HCC)    PCOS (polycystic ovarian syndrome)    Prediabetes     Past Surgical History:  Procedure Laterality Date   GANGLION CYST EXCISION Right 03/14/2021   Procedure: EXCISION OF RIGHT VOLAR CARPAL GANGLION;  Surgeon: Christena Flake, MD;  Location: [facility] ORS;  Service: Orthopedics;  Laterality: Right;   KNEE SURGERY Left     There were no vitals filed for this visit.   Subjective Assessment - 06/07/21 1527     Subjective  Doing okay- scar was little sore last time after I left here when you worked on it - but other wise feeling good    Pertinent History The patient is a 35 year old female with a several month history of progressive worsening radial sided volar right wrist pain. Her symptoms have persisted despite medications, activity modification, etc. Her history and examination are consistent with a volar carpal ganglion, confirmed by MRI scan. Pt is s/p excision of the right volar carpal ganglion on 03/14/2021.    Patient Stated Goals Pt would like to get back to using her right hand as she did before, return to work.    Currently in Pain? No/denies                 [facility] OT Assessment - 06/07/21 0001       Strength   Right Hand Grip (lbs) 40    Right Hand Lateral Pinch 22 lbs    Right Hand 3 Point Pinch 16 lbs    Left Hand Grip (lbs) 50    Left Hand Lateral Pinch 21 lbs    Left Hand 3 Point Pinch 16 lbs                      OT Treatments/Exercises (OP) - 06/07/21 0001       RUE Fluidotherapy   Number Minutes Fluidotherapy 8 Minutes    RUE Fluidotherapy Location Hand;Wrist    Comments AROM for wrist in all planes            Scar massage done lightly -and pt to cont with scar massage with wrist in extention Table slides - 20 reps - less of pull this date  And tolerate wall pushups this date - could not earlier this week     Full fisting on right, opposition to small finger and down to base of 5th without pain - and good strength   Cont with  [employer] care scar pad to place over scar at night to promote  healing of scar.        Pt to cont  with AAROM of wrist  flexion, ext, RD, UD And prayer stretch   Med firm green putty for grip and pulling with ulnar side of hand- 20 reps  2 sets  , each pain free   2 x day Keep pain free         OT Education - 06/07/21 1527     Education Details progress and changes to HEP    Person(s) Educated Patient    Methods Explanation;Demonstration;Handout    Comprehension Verbalized understanding;Need further instruction                 OT Long Term Goals - 05/14/21 0938       OT LONG TERM GOAL #1   Title Pt will be independent with home exercise program.    Baseline no current program    Time 6    Period Weeks    Status New    Target Date 06/20/21      OT LONG TERM GOAL #2   Title Pt will improve FOTO score to 71 or above to demonstrate a clinically relevant change to impact greater independence in necessary daily tasks.    Baseline 62 at eval    Time 6    Period Weeks    Status New    Target Date 06/20/21      OT LONG TERM GOAL #3   Title Pt will demonstrate  understanding of use of contrast to decrease edema.    Baseline no current knowledge    Time 2    Period Weeks    Status New    Target Date 05/21/21      OT LONG TERM GOAL #4   Title Pt will demonstrate improvement in wrist motion by 15 degrees to perform self care tasks with modified independence.    Baseline see flowsheet for details    Time 6    Period Weeks    Status New    Target Date 06/20/21      OT LONG TERM GOAL #5   Title Pt will demonstrate improvement in right supination by 10 degrees to assist with IADL tasks.    Baseline 70 degrees    Time 6    Period Weeks    Status New    Target Date 06/20/21                   Plan - 06/07/21 1528     Clinical Impression Statement Pt is a 35 yo female who is s/p excision of right volar carpal ganglion on 03/14/2021.  Pt is about 12 wks s/p - pt show great progress in AROM and strength in R wrist - grip strength still decrease compare to L - but prehension strength doing great - pt still limited in weight bearing thru R palm but improving - able this date to tolerate wall pushups bettter - pt to focus on scarmassage with wrist in extention , wall pushups and gripping with firm green putty - cont to use hands normally this next week - possible discharge next week.. Still small area of numbness over thenar eminence -and distal scar tissue still adhere and tender-. Can carry 8 lbs with ease and lift . - cont scar massage with wrist in extention .  Continue to work towards goals in plan of care to improve functional strength to return to work    OT Occupational Profile and History  Detailed Assessment- Review of Records and additional review of physical, cognitive, psychosocial history related to current functional performance    Occupational performance deficits (Please refer to evaluation for details): IADL's;ADL's;Work    Body Structure / Function / Physical Skills ADL;Dexterity;Flexibility;ROM;Strength;Coordination;FMC;IADL;Scar  mobility;Pain;Sensation;UE functional use    Rehab Potential Excellent    Clinical Decision Making Limited treatment options, no task modification necessary    Comorbidities Affecting Occupational Performance: May have comorbidities impacting occupational performance    Modification or Assistance to Complete Evaluation  No modification of tasks or assist necessary to complete eval    OT Frequency 1x / week    OT Duration 2 weeks    OT Treatment/Interventions Self-care/ADL training;Cryotherapy;Paraffin;Therapeutic exercise;DME and/or AE instruction;Ultrasound;Fluidtherapy;Neuromuscular education;Manual Therapy;Scar mobilization;Moist Heat;Contrast Bath;Passive range of motion;Therapeutic activities;Patient/family education    Consulted and Agree with Plan of Care Patient             Patient will benefit from skilled therapeutic intervention in order to improve the following deficits and impairments:   Body Structure / Function / Physical Skills: ADL, Dexterity, Flexibility, ROM, Strength, Coordination, FMC, IADL, Scar mobility, Pain, Sensation, UE functional use       Visit Diagnosis: Stiffness of right wrist, not elsewhere classified  Pain in right wrist  Muscle weakness (generalized)    Problem List Patient Active Problem List   Diagnosis Date Noted   PCOS (polycystic ovarian syndrome)    Prediabetes    Obesity, Class III, BMI 40-49.9 (morbid obesity) (HCC)     Rainee Sweatt OTR/l,CLT 06/07/2021, 3:32 PM  [place] [facility] [address], [place], [address] Phone: [phone]   Fax:  [phone]  Name: Amber Keller MRN: [id number] Date of Birth: 1985/10/09

## 2021-06-14 ENCOUNTER — Ambulatory Visit: Payer: Commercial Managed Care - PPO | Admitting: Occupational Therapy

## 2021-06-18 ENCOUNTER — Ambulatory Visit: Payer: Commercial Managed Care - PPO | Admitting: Occupational Therapy

## 2021-06-18 DIAGNOSIS — M25631 Stiffness of right wrist, not elsewhere classified: Secondary | ICD-10-CM

## 2021-06-18 DIAGNOSIS — M6281 Muscle weakness (generalized): Secondary | ICD-10-CM

## 2021-06-18 DIAGNOSIS — M25531 Pain in right wrist: Secondary | ICD-10-CM

## 2021-06-18 NOTE — Therapy (Signed)
[facility] [address] [place], [address] Phone: [phone]   Fax:  [phone]  Occupational Therapy Treatment/discharge  Patient Details  Name: Amber Keller MRN: [id number] Date of Birth: [birth year] Referring Provider (OT): Poggi   Encounter Date: 06/18/2021   OT End of Session - 06/18/21 1407     Visit Number 10    Number of Visits 12    Date for OT Re-Evaluation 06/20/21    OT Start Time 0815    OT Stop Time 0833    OT Time Calculation (min) 18 min    Activity Tolerance Patient tolerated treatment well    Behavior During Therapy [facility] for tasks assessed/performed             Past Medical History:  Diagnosis Date   Anxiety    COVID-19 2021   Depression    Genital herpes    Obesity, Class III, BMI 40-49.9 (morbid obesity) ([place])    PCOS (polycystic ovarian syndrome)    Prediabetes     Past Surgical History:  Procedure Laterality Date   GANGLION CYST EXCISION Right 03/14/2021   Procedure: EXCISION OF RIGHT VOLAR CARPAL GANGLION;  Surgeon: Corky Mull, MD;  Location: [facility] ORS;  Service: Orthopedics;  Laterality: Right;   KNEE SURGERY Left     There were no vitals filed for this visit.   Subjective Assessment - 06/18/21 0817     Subjective  Starting my new job today -adid resign at [employer]- doing okay - no pain and cleaned the house - hygiene also easier to do with no pain -    Pertinent History The patient is a 35 year old female with a several month history of progressive worsening radial sided volar right wrist pain. Her symptoms have persisted despite medications, activity modification, etc. Her history and examination are consistent with a volar carpal ganglion, confirmed by MRI scan. Pt is s/p excision of the right volar carpal ganglion on 03/14/2021.    Patient Stated Goals Pt would like to get back to using her right hand as she did before, return to work.    Currently in Pain?  No/denies                [facility] OT Assessment - 06/18/21 0001       AROM   Right Forearm Pronation 90 Degrees    Right Forearm Supination 90 Degrees    Left Forearm Pronation 90 Degrees    Left Forearm Supination 90 Degrees    Right Wrist Extension 70 Degrees    Right Wrist Flexion 85 Degrees    Right Wrist Radial Deviation 30 Degrees    Right Wrist Ulnar Deviation 35 Degrees      Strength   Right Hand Grip (lbs) 40    Right Hand Lateral Pinch 22 lbs    Right Hand 3 Point Pinch 16 lbs    Left Hand Grip (lbs) 50    Left Hand Lateral Pinch 21 lbs    Left Hand 3 Point Pinch 16 lbs                   Scar massage done lightly -and pt to cont with scar massage with wrist in extention- tender Table slides - 20 reps - less  wall pushups 10 reps    Full fisting on right, opposition to small finger and down to base of 5th without pain - and good strength   Cont with  [facility] care scar pad to place over scar at night to promote healing of scar.           Med firm green putty for grip and pulling with ulnar side of hand- 20 reps  2 sets  , each pain free   2 x day Keep pain free  They days if not as busy at work -otherwise just cont functional strength - return back to work today              OT Education - 06/18/21 1407     Education Details progress and changes to HEP    Person(s) Educated Patient    Methods Explanation;Demonstration;Handout    Comprehension Verbalized understanding;Need further instruction                 OT Long Term Goals - 05/14/21 0938       OT LONG TERM GOAL #1   Title Pt will be independent with home exercise program.    Baseline no current program    Time 6    Period Weeks    Status met   Target Date 06/20/21      OT LONG TERM GOAL #2   Title Pt will improve FOTO score to 71 or above to demonstrate a clinically relevant change to impact greater independence in necessary daily tasks.    Baseline 62 at eval     Time 6    Period Weeks    Status met   Target Date 06/20/21      OT LONG TERM GOAL #3   Title Pt will demonstrate understanding of use of contrast to decrease edema.    Baseline no current knowledge    Time 2    Period Weeks    Status met   Target Date 05/21/21      OT LONG TERM GOAL #4   Title Pt will demonstrate improvement in wrist motion by 15 degrees to perform self care tasks with modified independence.    Baseline see flowsheet for details    Time 6    Period Weeks    Status Met   Target Date 06/20/21      OT LONG TERM GOAL #5   Title Pt will demonstrate improvement in right supination by 10 degrees to assist with IADL tasks.    Baseline 70 degrees    Time 6    Period Weeks    Status Met   Target Date 06/20/21                   Plan - 06/18/21 1409     Clinical Impression Statement Pt is a 35 yo female who is s/p excision of right volar carpal ganglion on 03/14/2021.  Pt is about 13 wks s/p - pt show great progress in AROM and strength in R wrist - grip strength still decrease compare to L - but prehension strength doing great - pt  able to do wall push up but need to cont with  scarmassage with wrist in extention , wall pushups and gripping with firm green putty - cont to use hands normally.. Still small area of numbness over thenar eminence -and distal scar tissue still tender-. Can carry 8-10 lbs with ease and lift . - cont scar massage with wrist in extention - made great progress and met goals - and discharge at this time    OT Occupational Profile and History Detailed Assessment- Review of Records and additional review of physical,  cognitive, psychosocial history related to current functional performance    Occupational performance deficits (Please refer to evaluation for details): IADL's;ADL's;Work    Body Structure / Function / Physical Skills ADL;Dexterity;Flexibility;ROM;Strength;Coordination;FMC;IADL;Scar mobility;Pain;Sensation;UE functional use    Rehab  Potential Excellent    Clinical Decision Making Limited treatment options, no task modification necessary    Comorbidities Affecting Occupational Performance: May have comorbidities impacting occupational performance    Modification or Assistance to Complete Evaluation  No modification of tasks or assist necessary to complete eval    OT Frequency 1x / week    OT Duration 2 weeks    OT Treatment/Interventions Self-care/ADL training;Cryotherapy;Paraffin;Therapeutic exercise;DME and/or AE instruction;Ultrasound;Fluidtherapy;Neuromuscular education;Manual Therapy;Scar mobilization;Moist Heat;Contrast Bath;Passive range of motion;Therapeutic activities;Patient/family education    Consulted and Agree with Plan of Care Patient             Patient will benefit from skilled therapeutic intervention in order to improve the following deficits and impairments:   Body Structure / Function / Physical Skills: ADL, Dexterity, Flexibility, ROM, Strength, Coordination, FMC, IADL, Scar mobility, Pain, Sensation, UE functional use       Visit Diagnosis: Stiffness of right wrist, not elsewhere classified  Pain in right wrist  Muscle weakness (generalized)    Problem List Patient Active Problem List   Diagnosis Date Noted   PCOS (polycystic ovarian syndrome)    Prediabetes    Obesity, Class III, BMI 40-49.9 (morbid obesity) ([place])     Rosalyn Gess, OT/L 06/18/2021, 2:14 PM  [facility] [address], [place], [address] Phone: [phone]   Fax:  [phone]  Name: Amber Keller MRN: [id number] Date of Birth: Oct 28, 1985

## 2021-10-15 ENCOUNTER — Other Ambulatory Visit: Payer: Self-pay

## 2021-10-15 ENCOUNTER — Emergency Department
Admission: EM | Admit: 2021-10-15 | Discharge: 2021-10-15 | Disposition: A | Discharge status: Home / Self Care | Payer: 59 | Attending: Emergency Medicine | Admitting: Emergency Medicine

## 2021-10-15 DIAGNOSIS — Y92009 Unspecified place in unspecified non-institutional (private) residence as the place of occurrence of the external cause: Secondary | ICD-10-CM | POA: Insufficient documentation

## 2021-10-15 DIAGNOSIS — Z23 Encounter for immunization: Secondary | ICD-10-CM | POA: Insufficient documentation

## 2021-10-15 DIAGNOSIS — S6992XA Unspecified injury of left wrist, hand and finger(s), initial encounter: Secondary | ICD-10-CM | POA: Diagnosis present

## 2021-10-15 DIAGNOSIS — S61412A Laceration without foreign body of left hand, initial encounter: Secondary | ICD-10-CM | POA: Insufficient documentation

## 2021-10-15 DIAGNOSIS — W260XXA Contact with knife, initial encounter: Secondary | ICD-10-CM | POA: Diagnosis not present

## 2021-10-15 MED ORDER — TETANUS-DIPHTH-ACELL PERTUSSIS 5-2.5-18.5 LF-MCG/0.5 IM SUSY
0.5000 mL | PREFILLED_SYRINGE | Freq: Once | INTRAMUSCULAR | Status: AC
  Administered 2021-10-15: 0.5 mL via INTRAMUSCULAR
  Filled 2021-10-15: qty 0.5

## 2021-10-15 MED ORDER — LIDOCAINE HCL (PF) 1 % IJ SOLN
5.0000 mL | Freq: Once | INTRAMUSCULAR | Status: AC
  Administered 2021-10-15: 5 mL
  Filled 2021-10-15: qty 5

## 2021-10-15 NOTE — ED Provider Notes (Signed)
[facility] Provider Note    Event Date/Time   First MD Initiated Contact with Patient 10/15/21 [phone]     (approximate)   History   Laceration   HPI  Amber Keller is a 36 y.o. female   presents to the ED with a laceration to her left hand.  This occurred at home when she was trying to open up a soda bottle with a knife.  Patient has a history of anxiety, depression, prediabetes and smoking.  Rates her pain as an 8 out of 10.      Physical Exam   Triage Vital Signs: ED Triage Vitals  Enc Vitals Group     BP 10/15/21 0728 129/82     Pulse Rate 10/15/21 0728 87     Resp 10/15/21 0727 20     Temp 10/15/21 0727 98.9 F (37.2 C)     Temp Source 10/15/21 0727 Oral     SpO2 10/15/21 0728 96 %     Weight 10/15/21 0727 260 lb (117.9 kg)     Height 10/15/21 0727 5\' 2"  (1.575 m)     Head Circumference --      Peak Flow --      Pain Score 10/15/21 0727 8     Pain Loc --      Pain Edu? --      Excl. in GC? --     Most recent vital signs: Vitals:   10/15/21 0727 10/15/21 0728  BP:  129/82  Pulse:  87  Resp: 20   Temp: 98.9 F (37.2 C)   SpO2:  96%     General: Awake, no distress.  Anxious CV:  Good peripheral perfusion.  Radial pulse present.  Capillary refills less than 3 seconds. Resp:  Normal effort.  Abd:  No distention.  Other:  On examination of the left hand there is a 3 cm superficial laceration in the webspace between the thumb and index finger.  Bleeding is controlled with direct pressure.  No foreign bodies noted.  Patient is able to move index and thumb without any difficulty.  Motor sensory function intact.  Patient can fully flex and extend digits without any difficulty.   ED Results / Procedures / Treatments   Labs (all labs ordered are listed, but only abnormal results are displayed) Labs Reviewed - No data to display   PROCEDURES:  Critical Care performed: No  ..Laceration Repair  Date/Time: 10/15/2021 8:20  AM Performed by: 12/13/2021, PA-C Authorized by: Tommi Rumps, PA-C   Consent:    Consent obtained:  Verbal   Consent given by:  Patient   Risks, benefits, and alternatives were discussed: yes     Risks discussed:  Pain and infection Anesthesia:    Anesthesia method:  Local infiltration   Local anesthetic:  Lidocaine 1% w/o epi Laceration details:    Location:  Hand   Hand location:  L hand, dorsum   Length (cm):  3 Exploration:    Limited defect created (wound extended): no     Hemostasis achieved with:  Direct pressure   Contaminated: no   Treatment:    Amount of cleaning:  Standard   Irrigation solution:  Sterile saline   Irrigation volume:  60 ml   Irrigation method:  Syringe   Visualized foreign bodies/material removed: no     Debridement:  None Skin repair:    Repair method:  Sutures   Suture size:  5-0   Suture  material:  Nylon   Suture technique:  Simple interrupted   Number of sutures:  7 Approximation:    Approximation:  Close Repair type:    Repair type:  Simple Post-procedure details:    Dressing:  Non-adherent dressing   Procedure completion:  Tolerated   MEDICATIONS ORDERED IN ED: Medications  Tdap (BOOSTRIX) injection 0.5 mL (0.5 mLs Intramuscular Given 10/15/21 0805)  lidocaine (PF) (XYLOCAINE) 1 % injection 5 mL (5 mLs Infiltration Given 10/15/21 0806)     IMPRESSION / MDM / ASSESSMENT AND PLAN / ED COURSE  I reviewed the triage vital signs and the nursing notes.    Differential diagnosis includes, but is not limited to, laceration left hand, tendon injury, foreign body.       FINAL CLINICAL IMPRESSION(S) / ED DIAGNOSES   Final diagnoses:  Laceration of left hand without foreign body, initial encounter     Rx / DC Orders   ED Discharge Orders     None        Note:  This document was prepared using Dragon voice recognition software and may include unintentional dictation errors.   Tommi Rumps, PA-C 10/15/21  [id number]    Chesley Noon, MD 10/15/21 [phone]

## 2021-10-15 NOTE — ED Notes (Signed)
See triage note  presents with laceration to left hand   states she cut herself with a knife this am

## 2021-10-15 NOTE — ED Triage Notes (Signed)
Pt to ED for accidental laceration to left hand this am with knife . No bleeding at this time.  Unknown last tetanus

## 2021-10-15 NOTE — Discharge Instructions (Addendum)
You may follow-up with your primary care provider or urgent care to have sutures removed in 12 to 14 days.  Keep area clean and dry.  Clean daily with mild soap and water and allowed to dry completely before covering it.  Watch for any signs of infection.  You may take Tylenol or ibuprofen if needed for pain.  Elevate your hand today to reduce swelling.

## 2021-10-26 ENCOUNTER — Encounter: Payer: Self-pay | Admitting: Emergency Medicine

## 2021-10-26 ENCOUNTER — Other Ambulatory Visit: Payer: Self-pay

## 2021-10-26 ENCOUNTER — Ambulatory Visit
Admission: EM | Admit: 2021-10-26 | Discharge: 2021-10-26 | Disposition: A | Discharge status: Home / Self Care | Payer: 59 | Attending: Medical Oncology | Admitting: Medical Oncology

## 2021-10-26 DIAGNOSIS — T8130XA Disruption of wound, unspecified, initial encounter: Secondary | ICD-10-CM | POA: Diagnosis not present

## 2021-10-26 MED ORDER — DOXYCYCLINE HYCLATE 100 MG PO CAPS: 100.0000 mg | ORAL_CAPSULE | Freq: Two times a day (BID) | ORAL | 0 refills | Status: DC

## 2021-10-26 NOTE — Discharge Instructions (Addendum)
Aquaphor on the area.  A sports or water proof band-aid may work well

## 2021-10-26 NOTE — ED Triage Notes (Signed)
Patient states that she had laceration repair to her left hand on 10/15/21.  Patient states that she went to her PCP office today and had her sutures removed.  Patient states that while at home the incision opened back up.

## 2021-10-26 NOTE — ED Provider Notes (Signed)
[facility]    CSN: [id number] Arrival date & time: 10/26/21  1729      History   Chief Complaint Chief Complaint  Patient presents with   Wound Dehiscence    HPI Amber Keller is a 36 y.o. female.   HPI  Wound Issue: Patient had an injury on 10/15/2021 in which she cut her left hand.  She had her sutures removed today and states that she was try to stretch and see what kind of range of motion she had when the incision opened back up.  She was a bit concerned as she initially thought that there might be a small infection under the sutures to begin with.  She reports no fevers, pain or new discharge.  She has not tried anything for symptoms since it occurred today.  Of note she reports that she is up-to-date on her Tdap vaccine.  Past Medical History:  Diagnosis Date   Anxiety    COVID-19 2021   Depression    Genital herpes    Obesity, Class III, BMI 40-49.9 (morbid obesity) ([facility])    PCOS (polycystic ovarian syndrome)    Prediabetes     Patient Active Problem List   Diagnosis Date Noted   PCOS (polycystic ovarian syndrome)    Prediabetes    Obesity, Class III, BMI 40-49.9 (morbid obesity) ([facility])     Past Surgical History:  Procedure Laterality Date   GANGLION CYST EXCISION Right 03/14/2021   Procedure: EXCISION OF RIGHT VOLAR CARPAL GANGLION;  Surgeon: Christena Flake, MD;  Location: [facility] ORS;  Service: Orthopedics;  Laterality: Right;   KNEE SURGERY Left     OB History     Gravida  0   Para  0   Term  0   Preterm  0   AB  0   Living  0      SAB  0   IAB  0   Ectopic  0   Multiple  0   Live Births  0            Home Medications    Prior to Admission medications   Medication Sig Start Date End Date Taking? Authorizing Provider  omeprazole (PRILOSEC) 40 MG capsule Take 40 mg by mouth daily.   Yes [provider]  cyanocobalamin (,VITAMIN B-12,) 1000 MCG/ML injection Inject 1,000 mcg into the muscle once a week.     [provider]  albuterol (VENTOLIN HFA) 108 (90 Base) MCG/ACT inhaler Inhale 2 puffs into the lungs every 6 (six) hours as needed for wheezing or shortness of breath. 03/18/20 08/23/20  Sudie Grumbling, NP  fluticasone (FLONASE) 50 MCG/ACT nasal spray Place 2 sprays into both nostrils daily. 07/26/18 04/15/19  Domenick Gong, MD  phentermine (ADIPEX-P) 37.5 MG tablet TK ONE T PO QAM B BRE 03/04/17 07/06/20  [provider]  traZODone (DESYREL) 50 MG tablet 1-3 tabs po qhs prn insomnia 06/19/18 05/11/19  [provider]    Family History Family History  Problem Relation Age of Onset   ALS Paternal Aunt    Lung cancer Paternal Grandfather    Hypertension Mother    Breast cancer Father     Social History Social History   Tobacco Use   Smoking status: Every Day    Packs/day: 0.50    Years: 10.00    Pack years: 5.00    Types: Cigarettes   Smokeless tobacco: Never  Vaping Use   Vaping Use: Never used  Substance Use Topics   Alcohol use: Yes    Comment: occasional   Drug use: No     Allergies   Latex   Review of Systems Review of Systems  As stated above in HPI Physical Exam Triage Vital Signs ED Triage Vitals  Enc Vitals Group     BP 10/26/21 1739 130/89     Pulse Rate 10/26/21 1739 82     Resp 10/26/21 1739 14     Temp 10/26/21 1739 98.3 F (36.8 C)     Temp Source 10/26/21 1739 Oral     SpO2 10/26/21 1739 99 %     Weight 10/26/21 1737 263 lb (119.3 kg)     Height 10/26/21 1737 5\' 2"  (1.575 m)     Head Circumference --      Peak Flow --      Pain Score 10/26/21 1736 2     Pain Loc --      Pain Edu? --      Excl. in GC? --    No data found.  Updated Vital Signs BP 130/89 (BP Location: Right Arm)    Pulse 82    Temp 98.3 F (36.8 C) (Oral)    Resp 14    Ht 5\' 2"  (1.575 m)    Wt 263 lb (119.3 kg)    LMP 09/26/2021    SpO2 99%    BMI 48.10 kg/m   Physical Exam Vitals and nursing note reviewed.  Constitutional:      General:  She is not in acute distress.    Appearance: Normal appearance. She is not ill-appearing, toxic-appearing or diaphoretic.  HENT:     Head: Atraumatic.  Skin:    General: Skin is warm.     Comments: 1 inch superficially open wound with mild erythema and a glossy tone. No induration or fluctuance   Neurological:     Mental Status: She is alert.     UC Treatments / Results  Labs (all labs ordered are listed, but only abnormal results are displayed) Labs Reviewed - No data to display  EKG   Radiology No results found.  Procedures Procedures (including critical care time)  Medications Ordered in UC Medications - No data to display  Initial Impression / Assessment and Plan / UC Course  I have reviewed the triage vital signs and the nursing notes.  Pertinent labs & imaging results that were available during my care of the patient were reviewed by me and considered in my medical decision making (see chart for details).     New.  I cleaned the area.  We discussed risks and benefits of Steri-Strips.  I placed 2 Steri-Strips to lightly close the area but to allow enough space to avoid complications.  We are going to start her on doxycycline for a suspected staph infection.  We discussed risks and benefits along with wound care.  Follow-up as needed. Final Clinical Impressions(s) / UC Diagnoses   Final diagnoses:  None   Discharge Instructions   None    ED Prescriptions   None    PDMP not reviewed this encounter.   , 09/28/2021 10/26/21 1827

## 2021-11-01 ENCOUNTER — Emergency Department
Admission: EM | Admit: 2021-11-01 | Discharge: 2021-11-01 | Disposition: A | Discharge status: Home / Self Care | Payer: 59 | Attending: Emergency Medicine | Admitting: Emergency Medicine

## 2021-11-01 ENCOUNTER — Emergency Department: Payer: 59

## 2021-11-01 ENCOUNTER — Other Ambulatory Visit: Payer: Self-pay

## 2021-11-01 DIAGNOSIS — S300XXA Contusion of lower back and pelvis, initial encounter: Secondary | ICD-10-CM | POA: Insufficient documentation

## 2021-11-01 DIAGNOSIS — W01198A Fall on same level from slipping, tripping and stumbling with subsequent striking against other object, initial encounter: Secondary | ICD-10-CM | POA: Diagnosis not present

## 2021-11-01 DIAGNOSIS — S3992XA Unspecified injury of lower back, initial encounter: Secondary | ICD-10-CM | POA: Diagnosis present

## 2021-11-01 MED ORDER — CYCLOBENZAPRINE HCL 10 MG PO TABS: 10.0000 mg | ORAL_TABLET | Freq: Three times a day (TID) | ORAL | 0 refills | Status: DC | PRN

## 2021-11-01 MED ORDER — MELOXICAM 15 MG PO TABS: 15.0000 mg | ORAL_TABLET | Freq: Every day | ORAL | 2 refills | Status: AC

## 2021-11-01 MED ORDER — TRAMADOL HCL 50 MG PO TABS: 50.0000 mg | ORAL_TABLET | Freq: Four times a day (QID) | ORAL | 0 refills | Status: AC | PRN

## 2021-11-01 MED ORDER — IBUPROFEN 600 MG PO TABS
600.0000 mg | ORAL_TABLET | Freq: Once | ORAL | Status: AC
  Administered 2021-11-01: 600 mg via ORAL
  Filled 2021-11-01: qty 1

## 2021-11-01 NOTE — ED Notes (Signed)
See triage note  presents s/p fall   states she slipped on a wet ramp  having pain to lower back and right hip area  states she did fall twice  increased pain with standing and ambulation

## 2021-11-01 NOTE — ED Triage Notes (Signed)
Pt to ED for fall this morning on ramp with ice on it. Denies hitting head. Reports lower back pain.

## 2021-11-01 NOTE — ED Notes (Signed)
Pt back from xray. Still uncomfortable.

## 2021-11-01 NOTE — Discharge Instructions (Signed)
Follow-up with your regular doctor or orthopedics if not improved in 1 week.  Return emergency department worsening.  Apply ice to the lower back especially near the sacrum.  Find a round donut type pillow to sit on to take the pressure off of the tailbone.  Use your medication as prescribed.  Be aware that the Flexeril will make you drowsy.  You can cut this in half.  Tramadol for pain not controlled by the meloxicam and Flexeril.  Do not take over-the-counter ibuprofen or Aleve with the meloxicam.  But you may take Tylenol

## 2021-11-01 NOTE — ED Provider Notes (Signed)
[facility] Provider Note    Event Date/Time   First MD Initiated Contact with Patient 11/01/21 [phone]     (approximate)   History   Fall   HPI  Amber Keller is a 36 y.o. female presents emergency department after a fall.  Patient slipped on ice that was on a ramp.  Landed on her tailbone and hit the upper back.  States pain is mostly in her lower back.  No numbness or tingling.  No wrist pain no ankle pain.  No head injury or LOC.  No loss of bowel or bladder control     Physical Exam   Triage Vital Signs: ED Triage Vitals  Enc Vitals Group     BP 11/01/21 0723 133/86     Pulse Rate 11/01/21 0723 69     Resp 11/01/21 0723 16     Temp 11/01/21 0723 98.5 F (36.9 C)     Temp Source 11/01/21 0723 Oral     SpO2 11/01/21 0723 98 %     Weight 11/01/21 0721 264 lb 8.8 oz (120 kg)     Height 11/01/21 0721 5\' 2"  (1.575 m)     Head Circumference --      Peak Flow --      Pain Score 11/01/21 0721 8     Pain Loc --      Pain Edu? --      Excl. in [place]? --     Most recent vital signs: Vitals:   11/01/21 0723  BP: 133/86  Pulse: 69  Resp: 16  Temp: 98.5 F (36.9 C)  SpO2: 98%     General: Awake, no distress.   CV:  Good peripheral perfusion. regular rate and  rhythm Resp:  Normal effort. Lungs CTA Abd:  No distention.   Other:  Coccyx and sacrum are tender, upper spine is nontender, hip is not tender, wrist are nontender, neurovascular is intact   ED Results / Procedures / Treatments   Labs (all labs ordered are listed, but only abnormal results are displayed) Labs Reviewed - No data to display   EKG     RADIOLOGY X-ray of the sacrum and coccyx    PROCEDURES:   Procedures   MEDICATIONS ORDERED IN ED: Medications  ibuprofen (ADVIL) tablet 600 mg (600 mg Oral Given 11/01/21 0747)     IMPRESSION / MDM / [place] / ED COURSE  I reviewed the triage vital signs and the nursing notes.                               Differential diagnosis includes, but is not limited to, coccyx fracture, contusion, lumbar fracture  X-ray of the sacrum/coccyx  I did review the x-ray, I do not see a fracture.  This was confirmed by radiology  I did explain these findings to the patient.  She was given ibuprofen here in the ED for pain.  She is driving so we cannot give her anything stronger.  She was given a prescription for meloxicam, Flexeril, and tramadol.  She was instructed to use these medications as prescribed.  She is not to take additional ibuprofen or Aleve with the meloxicam.  Warned that the Flexeril can make her drowsy.  Tramadol can make her drowsy.  She is apply ice to the area that is injured.  Follow-up with her regular doctor if not improving in 3 to 4 days.  Or she can follow-up with orthopedics.  Return emergency department worsening.  She is in agreement with treatment plan.  Patient was discharged in stable condition.       FINAL CLINICAL IMPRESSION(S) / ED DIAGNOSES   Final diagnoses:  Contusion of sacrum, initial encounter     Rx / DC Orders   ED Discharge Orders          Ordered    meloxicam (MOBIC) 15 MG tablet  Daily        11/01/21 0843    cyclobenzaprine (FLEXERIL) 10 MG tablet  3 times daily PRN        11/01/21 0843    traMADol (ULTRAM) 50 MG tablet  Every 6 hours PRN        11/01/21 0843             Note:  This document was prepared using Dragon voice recognition software and may include unintentional dictation errors.    Versie Starks, PA-C 11/01/21 [id number]    Harvest Dark, MD 11/01/21 1319

## 2021-11-01 NOTE — ED Notes (Signed)
Helped pt reposition in bed. Pt stated she was uncomfortable.

## 2021-11-01 NOTE — ED Notes (Signed)
Patient transported to X-ray 

## 2021-11-10 ENCOUNTER — Encounter: Payer: Self-pay | Admitting: Emergency Medicine

## 2021-11-10 ENCOUNTER — Ambulatory Visit
Admission: EM | Admit: 2021-11-10 | Discharge: 2021-11-10 | Disposition: A | Discharge status: Home / Self Care | Payer: 59 | Attending: Medical Oncology | Admitting: Medical Oncology

## 2021-11-10 ENCOUNTER — Other Ambulatory Visit: Payer: Self-pay

## 2021-11-10 ENCOUNTER — Ambulatory Visit (INDEPENDENT_AMBULATORY_CARE_PROVIDER_SITE_OTHER): Payer: 59

## 2021-11-10 DIAGNOSIS — B9689 Other specified bacterial agents as the cause of diseases classified elsewhere: Secondary | ICD-10-CM | POA: Diagnosis not present

## 2021-11-10 DIAGNOSIS — M533 Sacrococcygeal disorders, not elsewhere classified: Secondary | ICD-10-CM

## 2021-11-10 DIAGNOSIS — N760 Acute vaginitis: Secondary | ICD-10-CM | POA: Diagnosis not present

## 2021-11-10 LAB — URINALYSIS, ROUTINE W REFLEX MICROSCOPIC
Bilirubin Urine: NEGATIVE
Glucose, UA: NEGATIVE mg/dL
Hgb urine dipstick: NEGATIVE
Ketones, ur: NEGATIVE mg/dL
Leukocytes,Ua: NEGATIVE
Nitrite: NEGATIVE
Protein, ur: NEGATIVE mg/dL
Specific Gravity, Urine: 1.025 (ref 1.005–1.030)
pH: 7 (ref 5.0–8.0)

## 2021-11-10 LAB — WET PREP, GENITAL
Sperm: NONE SEEN
Trich, Wet Prep: NONE SEEN
WBC, Wet Prep HPF POC: <10 (ref <10)
Yeast Wet Prep HPF POC: NONE SEEN

## 2021-11-10 MED ORDER — METRONIDAZOLE 500 MG PO TABS: 500.0000 mg | ORAL_TABLET | Freq: Two times a day (BID) | ORAL | 0 refills | Status: DC

## 2021-11-10 MED ORDER — PREDNISONE 10 MG (21) PO TBPK: ORAL_TABLET | Freq: Every day | ORAL | 0 refills | Status: DC

## 2021-11-10 NOTE — ED Triage Notes (Addendum)
Patient c/o vaginal discharge that started 2 days ago.  Patient also reports ongoing pain in her tailbone.  Patient states that she fell a week ago and was seen in the ED and had negative x-ray of her tailbone.

## 2021-11-10 NOTE — ED Provider Notes (Signed)
[facility]    CSN: [id number] Arrival date & time: 11/10/21  1231      History   Chief Complaint Chief Complaint  Patient presents with   Vaginal Discharge    HPI Amber Keller is a 36 y.o. female.   HPI  Vaginal Discharge: Patient reports that she has had vaginal discharge that started 2 days ago.  Noticed this yesterday after sexual intercourse.  She describes symptoms as dry but also with discharge.  No fevers, dysuria, no new partners.  She has not tried anything for symptoms.  Tailbone pain: Patient reports that she fell over 1 week ago and was seen in the ED as she had severe tailbone pain.  Her x-rays were negative for fracture however her pain has not improved at all.  She also has noticed some constipation but is able to have flatulence and denies any saddle anesthesia.  She has had 1 movement recently and no other neuro changes are noted.  She has tried tramadol, meloxicam, Flexeril and ibuprofen without much improvement  Past Medical History:  Diagnosis Date   Anxiety    COVID-19 2021   Depression    Genital herpes    Obesity, Class III, BMI 40-49.9 (morbid obesity) (HCC)    PCOS (polycystic ovarian syndrome)    Prediabetes     Patient Active Problem List   Diagnosis Date Noted   PCOS (polycystic ovarian syndrome)    Prediabetes    Obesity, Class III, BMI 40-49.9 (morbid obesity) (HCC)     Past Surgical History:  Procedure Laterality Date   GANGLION CYST EXCISION Right 03/14/2021   Procedure: EXCISION OF RIGHT VOLAR CARPAL GANGLION;  Surgeon: Christena FlakePoggi, John J, MD;  Location: [facility] ORS;  Service: Orthopedics;  Laterality: Right;   KNEE SURGERY Left     OB History     Gravida  0   Para  0   Term  0   Preterm  0   AB  0   Living  0      SAB  0   IAB  0   Ectopic  0   Multiple  0   Live Births  0            Home Medications    Prior to Admission medications   Medication Sig Start Date End Date Taking?  Authorizing Provider  cyanocobalamin (,VITAMIN B-12,) 1000 MCG/ML injection Inject 1,000 mcg into the muscle once a week.    [provider]  cyclobenzaprine (FLEXERIL) 10 MG tablet Take 1 tablet (10 mg total) by mouth 3 (three) times daily as needed. 11/01/21   Fisher, Roselyn BeringSusan W, PA-C  doxycycline (VIBRAMYCIN) 100 MG capsule Take 1 capsule (100 mg total) by mouth 2 (two) times daily. 10/26/21   Rushie Chestnutovington, Chozen Latulippe M, PA-C  meloxicam (MOBIC) 15 MG tablet Take 1 tablet (15 mg total) by mouth daily. 11/01/21 11/01/22  Fisher, Roselyn BeringSusan W, PA-C  omeprazole (PRILOSEC) 40 MG capsule Take 40 mg by mouth daily.    [provider]  traMADol (ULTRAM) 50 MG tablet Take 1 tablet (50 mg total) by mouth every 6 (six) hours as needed. 11/01/21   Fisher, Roselyn BeringSusan W, PA-C  albuterol (VENTOLIN HFA) 108 (90 Base) MCG/ACT inhaler Inhale 2 puffs into the lungs every 6 (six) hours as needed for wheezing or shortness of breath. 03/18/20 08/23/20  Sudie GrumblingAmyot, Ann Berry, NP  fluticasone (FLONASE) 50 MCG/ACT nasal spray Place 2 sprays into both nostrils daily. 07/26/18 04/15/19  Mortenson,  Morrie Sheldon, MD  phentermine (ADIPEX-P) 37.5 MG tablet TK ONE T PO QAM B BRE 03/04/17 07/06/20  [provider]  traZODone (DESYREL) 50 MG tablet 1-3 tabs po qhs prn insomnia 06/19/18 05/11/19  [provider]    Family History Family History  Problem Relation Age of Onset   ALS Paternal Aunt    Lung cancer Paternal Grandfather    Hypertension Mother    Breast cancer Father     Social History Social History   Tobacco Use   Smoking status: Every Day    Packs/day: 0.50    Years: 10.00    Pack years: 5.00    Types: Cigarettes   Smokeless tobacco: Never  Vaping Use   Vaping Use: Never used  Substance Use Topics   Alcohol use: Yes    Comment: occasional   Drug use: No     Allergies   Latex   Review of Systems Review of Systems  As stated above in HPI Physical Exam Triage Vital Signs ED Triage Vitals  Enc  Vitals Group     BP 11/10/21 1427 (!) 148/100     Pulse Rate 11/10/21 1427 65     Resp 11/10/21 1427 14     Temp 11/10/21 1427 98.6 F (37 C)     Temp Source 11/10/21 1427 Oral     SpO2 11/10/21 1427 97 %     Weight 11/10/21 1425 264 lb 8.8 oz (120 kg)     Height 11/10/21 1425 5\' 2"  (1.575 m)     Head Circumference --      Peak Flow --      Pain Score 11/10/21 1425 7     Pain Loc --      Pain Edu? --      Excl. in GC? --    No data found.  Updated Vital Signs BP (!) 148/100 (BP Location: Right Arm)    Pulse 65    Temp 98.6 F (37 C) (Oral)    Resp 14    Ht 5\' 2"  (1.575 m)    Wt 264 lb 8.8 oz (120 kg)    LMP 11/01/2021    SpO2 97%    BMI 48.39 kg/m   Physical Exam Vitals and nursing note reviewed.  Constitutional:      General: She is not in acute distress.    Appearance: Normal appearance. She is not ill-appearing, toxic-appearing or diaphoretic.  Genitourinary:    Comments: Patient performed self swab collection Neurological:     Mental Status: She is alert and oriented to person, place, and time.     Motor: No weakness.     Coordination: Coordination normal.     Gait: Gait normal.     UC Treatments / Results  Labs (all labs ordered are listed, but only abnormal results are displayed) Labs Reviewed  WET PREP, GENITAL - Abnormal; Notable for the following components:      Result Value   Clue Cells Wet Prep HPF POC PRESENT (*)    All other components within normal limits  URINALYSIS, ROUTINE W REFLEX MICROSCOPIC    EKG   Radiology No results found.  Procedures Procedures (including critical care time)  Medications Ordered in UC Medications - No data to display  Initial Impression / Assessment and Plan / UC Course  I have reviewed the triage vital signs and the nursing notes.  Pertinent labs & imaging results that were available during my care of the patient were reviewed by me  and considered in my medical decision making (see chart for details).      New.  We discussed bacterial vaginosis and will treat with Flagyl.  Repeat imaging for her tailbone pain to see if there is a hairline fracture.  I would recommend MiraLAX her slightly slower than normal will stools and we discussed red flag signs and symptoms. Ortho or Neurosurgery follow up if pain continues.  Final Clinical Impressions(s) / UC Diagnoses   Final diagnoses:  None   Discharge Instructions   None    ED Prescriptions   None    PDMP not reviewed this encounter.   Rushie Chestnut, [place] 11/10/21 1518

## 2022-03-17 ENCOUNTER — Ambulatory Visit
Admission: EM | Admit: 2022-03-17 | Discharge: 2022-03-17 | Disposition: A | Discharge status: Home / Self Care | Payer: 59 | Attending: Physician Assistant | Admitting: Physician Assistant

## 2022-03-17 DIAGNOSIS — R7303 Prediabetes: Secondary | ICD-10-CM | POA: Diagnosis not present

## 2022-03-17 DIAGNOSIS — R509 Fever, unspecified: Secondary | ICD-10-CM | POA: Insufficient documentation

## 2022-03-17 DIAGNOSIS — J209 Acute bronchitis, unspecified: Secondary | ICD-10-CM | POA: Insufficient documentation

## 2022-03-17 DIAGNOSIS — R051 Acute cough: Secondary | ICD-10-CM | POA: Diagnosis not present

## 2022-03-17 DIAGNOSIS — E669 Obesity, unspecified: Secondary | ICD-10-CM | POA: Diagnosis not present

## 2022-03-17 DIAGNOSIS — Z6841 Body Mass Index (BMI) 40.0 and over, adult: Secondary | ICD-10-CM | POA: Insufficient documentation

## 2022-03-17 DIAGNOSIS — Z20822 Contact with and (suspected) exposure to covid-19: Secondary | ICD-10-CM | POA: Insufficient documentation

## 2022-03-17 DIAGNOSIS — F17210 Nicotine dependence, cigarettes, uncomplicated: Secondary | ICD-10-CM | POA: Insufficient documentation

## 2022-03-17 DIAGNOSIS — E282 Polycystic ovarian syndrome: Secondary | ICD-10-CM | POA: Insufficient documentation

## 2022-03-17 LAB — RESP PANEL BY RT-PCR (FLU A&B, COVID) ARPGX2
Influenza A by PCR: NEGATIVE
Influenza B by PCR: NEGATIVE
SARS Coronavirus 2 by RT PCR: NEGATIVE

## 2022-03-17 MED ORDER — ALBUTEROL SULFATE HFA 108 (90 BASE) MCG/ACT IN AERS: 1.0000 | INHALATION_SPRAY | Freq: Four times a day (QID) | RESPIRATORY_TRACT | 0 refills | Status: AC | PRN

## 2022-03-17 MED ORDER — GUAIFENESIN-CODEINE 100-10 MG/5ML PO SYRP: 10.0000 mL | ORAL_SOLUTION | Freq: Three times a day (TID) | ORAL | 0 refills | Status: DC | PRN

## 2022-03-17 MED ORDER — IPRATROPIUM BROMIDE 0.06 % NA SOLN: 2.0000 | Freq: Four times a day (QID) | NASAL | 0 refills | Status: DC

## 2022-03-17 MED ORDER — PREDNISONE 20 MG PO TABS: 40.0000 mg | ORAL_TABLET | Freq: Every day | ORAL | 0 refills | Status: AC

## 2022-03-17 NOTE — ED Triage Notes (Signed)
X3 days,Fever, cough clear/yellow mucous, tooth pain, headache, congestion, tired alk. Plus PM helped some, wants to be tested for covid, has not been around anyone who tested pos.

## 2022-03-17 NOTE — ED Provider Notes (Signed)
[facility]    CSN: [id number] Arrival date & time: 03/17/22  1010      History   Chief Complaint Chief Complaint  Patient presents with   Cough    HPI Amber Keller is a 36 y.o. female presenting for 3-day history of low-grade temps up to 100 degrees, fatigue, productive cough, headaches, nasal congestion and postnasal drainage.  Patient says she had a mild sore throat at onset but it does not really hurt now.  She denies sinus pain, breathing difficulty or chest pain, abdominal pain, nausea/vomiting or diarrhea.  Denies any sick contacts.  She would like to be tested for COVID-19.  She has taken Alka-Seltzer plus p.m. last night for the cough.  Patient is a current everyday smoker but denies history of chronic bronchitis, asthma or other breathing issues.  Does have a history of obesity, PCOS and prediabetes.  HPI  Past Medical History:  Diagnosis Date   Anxiety    COVID-19 2021   Depression    Genital herpes    Obesity, Class III, BMI 40-49.9 (morbid obesity) (HCC)    PCOS (polycystic ovarian syndrome)    Prediabetes     Patient Active Problem List   Diagnosis Date Noted   PCOS (polycystic ovarian syndrome)    Prediabetes    Obesity, Class III, BMI 40-49.9 (morbid obesity) (HCC)     Past Surgical History:  Procedure Laterality Date   GANGLION CYST EXCISION Right 03/14/2021   Procedure: EXCISION OF RIGHT VOLAR CARPAL GANGLION;  Surgeon: Christena Flake, MD;  Location: [facility] OR[facility];  Service: Orthopedics;  Laterality: Right;   KNEE SURGERY Left     OB History     Gravida  0   Para  0   Term  0   Preterm  0   AB  0   Living  0      SAB  0   IAB  0   Ectopic  0   Multiple  0   Live Births  0            Home Medications    Prior to Admission medications   Medication Sig Start Date End Date Taking? Authorizing Provider  albuterol (VENTOLIN HFA) 108 (90 Base) MCG/ACT inhaler Inhale 1-2 puffs into the lungs every 6 (six) hours  as needed for wheezing or shortness of breath. 03/17/22  Yes Eusebio Friendly B, PA-C  cyclobenzaprine (FLEXERIL) 10 MG tablet Take 1 tablet (10 mg total) by mouth 3 (three) times daily as needed. 11/01/21  Yes Fisher, Roselyn Bering, PA-C  guaiFENesin-codeine (ROBITUSSIN AC) 100-10 MG/5ML syrup Take 10 mLs by mouth 3 (three) times daily as needed for cough. 03/17/22  Yes Eusebio Friendly B, PA-C  ipratropium (ATROVENT) 0.06 % nasal spray Place 2 sprays into both nostrils 4 (four) times daily. 03/17/22  Yes Shirlee Latch, PA-C  meloxicam (MOBIC) 15 MG tablet Take 1 tablet (15 mg total) by mouth daily. 11/01/21 11/01/22 Yes Fisher, Roselyn Bering, PA-C  metroNIDAZOLE (FLAGYL) 500 MG tablet Take 1 tablet (500 mg total) by mouth 2 (two) times daily. 11/10/21  Yes Covington, Sarah M, PA-C  omeprazole (PRILOSEC) 40 MG capsule Take 40 mg by mouth daily.   Yes [provider]  predniSONE (DELTASONE) 20 MG tablet Take 2 tablets (40 mg total) by mouth daily for 5 days. 03/17/22 03/22/22 Yes Shirlee Latch, PA-C  traMADol (ULTRAM) 50 MG tablet Take 1 tablet (50 mg total) by mouth every 6 (six)  hours as needed. 11/01/21  Yes Fisher, Roselyn BeringSusan W, PA-C  cyanocobalamin (,VITAMIN B-12,) 1000 MCG/ML injection Inject 1,000 mcg into the muscle once a week.    [provider]  doxycycline (VIBRAMYCIN) 100 MG capsule Take 1 capsule (100 mg total) by mouth 2 (two) times daily. 10/26/21   Rushie Chestnutovington, Sarah M, PA-C  fluticasone (FLONASE) 50 MCG/ACT nasal spray Place 2 sprays into both nostrils daily. 07/26/18 04/15/19  Domenick GongMortenson, Ashley, MD  phentermine (ADIPEX-P) 37.5 MG tablet TK ONE T PO QAM B BRE 03/04/17 07/06/20  [provider]  traZODone (DESYREL) 50 MG tablet 1-3 tabs po qhs prn insomnia 06/19/18 05/11/19  [provider]    Family History Family History  Problem Relation Age of Onset   ALS Paternal Aunt    Lung cancer Paternal Grandfather    Hypertension Mother    Breast cancer Father     Social  History Social History   Tobacco Use   Smoking status: Every Day    Packs/day: 0.50    Years: 10.00    Total pack years: 5.00    Types: Cigarettes   Smokeless tobacco: Never  Vaping Use   Vaping Use: Never used  Substance Use Topics   Alcohol use: Yes    Comment: occasional   Drug use: No     Allergies   Latex   Review of Systems Review of Systems  Constitutional:  Positive for fatigue and fever. Negative for chills and diaphoresis.  HENT:  Positive for congestion, rhinorrhea and sore throat. Negative for ear pain, sinus pressure and sinus pain.   Respiratory:  Positive for cough. Negative for shortness of breath.   Cardiovascular:  Negative for chest pain.  Gastrointestinal:  Negative for abdominal pain, nausea and vomiting.  Musculoskeletal:  Negative for arthralgias and myalgias.  Skin:  Negative for rash.  Neurological:  Positive for headaches. Negative for weakness.  Hematological:  Negative for adenopathy.     Physical Exam Triage Vital Signs ED Triage Vitals  Enc Vitals Group     BP 03/17/22 1023 123/80     Pulse Rate 03/17/22 1023 85     Resp 03/17/22 1023 20     Temp 03/17/22 1023 98.7 F (37.1 C)     Temp Source 03/17/22 1023 Oral     SpO2 03/17/22 1023 95 %     Weight 03/17/22 1018 260 lb (117.9 kg)     Height 03/17/22 1018 5\' 2"  (1.575 m)     Head Circumference --      Peak Flow --      Pain Score 03/17/22 1017 3     Pain Loc --      Pain Edu? --      Excl. in GC? --    No data found.  Updated Vital Signs BP 123/80 (BP Location: Left Arm)   Pulse 85   Temp 98.7 F (37.1 C) (Oral)   Resp 20   Ht 5\' 2"  (1.575 m)   Wt 260 lb (117.9 kg)   LMP 01/25/2022   SpO2 95%   BMI 47.55 kg/m      Physical Exam Vitals and nursing note reviewed.  Constitutional:      General: She is not in acute distress.    Appearance: Normal appearance. She is ill-appearing. She is not toxic-appearing.  HENT:     Head: Normocephalic and atraumatic.      Right Ear: Tympanic membrane, ear canal and external ear normal.     Left  Ear: Tympanic membrane, ear canal and external ear normal.     Nose: Congestion present.     Mouth/Throat:     Mouth: Mucous membranes are moist.     Pharynx: Oropharynx is clear.  Eyes:     General: No scleral icterus.       Right eye: No discharge.        Left eye: No discharge.     Conjunctiva/sclera: Conjunctivae normal.  Cardiovascular:     Rate and Rhythm: Normal rate and regular rhythm.     Heart sounds: Normal heart sounds.  Pulmonary:     Effort: Pulmonary effort is normal. No respiratory distress.     Breath sounds: Rhonchi (diffuse rhonchi throughout all lung fields) present.  Musculoskeletal:     Cervical back: Neck supple.  Skin:    General: Skin is dry.  Neurological:     General: No focal deficit present.     Mental Status: She is alert. Mental status is at baseline.     Motor: No weakness.     Gait: Gait normal.  Psychiatric:        Mood and Affect: Mood normal.        Behavior: Behavior normal.        Thought Content: Thought content normal.      UC Treatments / Results  Labs (all labs ordered are listed, but only abnormal results are displayed) Labs Reviewed  RESP PANEL BY RT-PCR (FLU A&B, COVID) [id number]    EKG   Radiology No results found.  Procedures Procedures (including critical care time)  Medications Ordered in UC Medications - No data to display  Initial Impression / Assessment and Plan / UC Course  I have reviewed the triage vital signs and the nursing notes.  Pertinent labs & imaging results that were available during my care of the patient were reviewed by me and considered in my medical decision making (see chart for details).   36 year old female presenting for 3-day history of fatigue, low-grade fever up to 100 degrees, productive cough, nasal congestion and sinus pressure as well as headaches.  Vitals normal and stable.  Patient ill-appearing but  nontoxic.  Nasal congestion on exam as well as postnasal drainage.  Diffuse rhonchi throughout all lung fields.  No respiratory distress and patient is denying any breathing difficulty.  Respiratory panel obtained.  All negative.  Discussed results with patient.  Advised patient symptoms and presentation consistent with viral bronchitis.  We will treat at this time with Cheratussin, albuterol and Atrovent nasal spray.  I also printed a prescription for prednisone in case she is not feeling better in a couple of days.  She wants to hold off on taking it if possible.  Encouraged her to increase rest and fluid intake.  Reviewed returning if she develops a higher fever, increased fatigue or weakness, chest pain or breathing difficulty.   Final Clinical Impressions(s) / UC Diagnoses   Final diagnoses:  Acute bronchitis, unspecified organism  Acute cough  Low grade fever     Discharge Instructions      -Your respiratory panel is negative for flu and COVID. - You do have bronchitis which is generally due to a virus.  I have sent cough medicine, an inhaler, and nasal spray to the pharmacy.  I printed a prescription for prednisone in case you are not starting to improve the next couple of days. - Increase rest and fluids. - If you develop a fever greater than 100.4 degrees or  have increased shortness of breath, chest pain or are feeling worse, you should be reevaluated and consider chest x-ray. - Bronchitis can last for couple of weeks sometimes you should return if symptoms are worsening.     ED Prescriptions     Medication Sig Dispense Auth. Provider   albuterol (VENTOLIN HFA) 108 (90 Base) MCG/ACT inhaler Inhale 1-2 puffs into the lungs every 6 (six) hours as needed for wheezing or shortness of breath. 1 g Eusebio Friendly B, PA-C   guaiFENesin-codeine (ROBITUSSIN AC) 100-10 MG/5ML syrup Take 10 mLs by mouth 3 (three) times daily as needed for cough. 120 mL Eusebio Friendly B, PA-C   ipratropium  (ATROVENT) 0.06 % nasal spray Place 2 sprays into both nostrils 4 (four) times daily. 15 mL Eusebio Friendly B, PA-C   predniSONE (DELTASONE) 20 MG tablet Take 2 tablets (40 mg total) by mouth daily for 5 days. 10 tablet Shirlee Latch, PA-C      I have reviewed the PDMP during this encounter.   Shirlee Latch, PA-C 03/17/22 1134

## 2022-03-17 NOTE — Discharge Instructions (Addendum)
-  Your respiratory panel is negative for flu and COVID. - You do have bronchitis which is generally due to a virus.  I have sent cough medicine, an inhaler, and nasal spray to the pharmacy.  I printed a prescription for prednisone in case you are not starting to improve the next couple of days. - Increase rest and fluids. - If you develop a fever greater than 100.4 degrees or have increased shortness of breath, chest pain or are feeling worse, you should be reevaluated and consider chest x-ray. - Bronchitis can last for couple of weeks sometimes you should return if symptoms are worsening.

## 2022-05-28 ENCOUNTER — Ambulatory Visit
Admission: RE | Admit: 2022-05-28 | Discharge: 2022-05-28 | Disposition: A | Discharge status: Home / Self Care | Payer: 59 | Source: Ambulatory Visit | Attending: Family | Admitting: Family

## 2022-05-28 VITALS — BP 141/93 | HR 62 | Temp 98.2°F | Ht 62.0 in | Wt 265.0 lb

## 2022-05-28 DIAGNOSIS — M62830 Muscle spasm of back: Secondary | ICD-10-CM

## 2022-05-28 DIAGNOSIS — M545 Low back pain, unspecified: Secondary | ICD-10-CM

## 2022-05-28 MED ORDER — ORPHENADRINE CITRATE ER 100 MG PO TB12: 100.0000 mg | ORAL_TABLET | Freq: Two times a day (BID) | ORAL | 0 refills | Status: AC | PRN

## 2022-05-28 MED ORDER — KETOROLAC TROMETHAMINE 60 MG/2ML IM SOLN
60.0000 mg | Freq: Once | INTRAMUSCULAR | Status: AC
  Administered 2022-05-28: 60 mg via INTRAMUSCULAR

## 2022-05-28 NOTE — ED Triage Notes (Signed)
Pt c/o LT lower back pain onset weekend, denies any fall or injury

## 2022-05-28 NOTE — ED Provider Notes (Signed)
[facility]    CSN: [id number] Arrival date & time: 05/28/22  1559      History   Chief Complaint Chief Complaint  Patient presents with   Back Pain    HPI Amber Keller is a 36 y.o. female.   36 year old female presents with left lower back pain that started about 2 days ago. Started shortly after getting out of bed- noticed more pain with movements. No known injury or trauma. Walked frequently at work yesterday and pain seemed to improve slightly but then was much worse today. Pain now in left lower back and traveling to left hip area. No radiation of pain down leg. No numbness. Has been taking Mobic with no relief. Does have a history of sciatica. Has taken Toradol injection in the past with some relief. Has also been prescribed Ultram and has some pills left over at home but has not taken any yet. Other chronic health issues include GERD. Was on Prilosec but RX ran out. Other chronic health issues include PCOS and Prediabetes.   The history is provided by the patient.    Past Medical History:  Diagnosis Date   Anxiety    COVID-19 2021   Depression    Genital herpes    Obesity, Class III, BMI 40-49.9 (morbid obesity) (HCC)    PCOS (polycystic ovarian syndrome)    Prediabetes     Patient Active Problem List   Diagnosis Date Noted   PCOS (polycystic ovarian syndrome)    Prediabetes    Obesity, Class III, BMI 40-49.9 (morbid obesity) (HCC)     Past Surgical History:  Procedure Laterality Date   GANGLION CYST EXCISION Right 03/14/2021   Procedure: EXCISION OF RIGHT VOLAR CARPAL GANGLION;  Surgeon: Christena Flake, MD;  Location: [facility] ORS;  Service: Orthopedics;  Laterality: Right;   KNEE SURGERY Left     OB History     Gravida  0   Para  0   Term  0   Preterm  0   AB  0   Living  0      SAB  0   IAB  0   Ectopic  0   Multiple  0   Live Births  0            Home Medications    Prior to Admission medications   Medication  Sig Start Date End Date Taking? Authorizing Provider  meloxicam (MOBIC) 15 MG tablet Take 1 tablet (15 mg total) by mouth daily. 11/01/21 11/01/22 Yes Fisher, Roselyn Bering, PA-C  orphenadrine (NORFLEX) 100 MG tablet Take 1 tablet (100 mg total) by mouth 2 (two) times daily as needed for muscle spasms. 05/28/22  Yes Mccormick Macon, Ali Lowe, NP  traMADol (ULTRAM) 50 MG tablet Take 1 tablet (50 mg total) by mouth every 6 (six) hours as needed. 11/01/21  Yes Fisher, Roselyn Bering, PA-C  albuterol (VENTOLIN HFA) 108 (90 Base) MCG/ACT inhaler Inhale 1-2 puffs into the lungs every 6 (six) hours as needed for wheezing or shortness of breath. 03/17/22   Eusebio Friendly B, PA-C  cyanocobalamin (,VITAMIN B-12,) 1000 MCG/ML injection Inject 1,000 mcg into the muscle once a week.    [provider]  lidocaine (LIDODERM) 5 % Place 1 patch onto the skin every 12 (twelve) hours for 5 days. Remove & Discard patch within 12 hours or as directed by MD 05/29/22 06/03/22  Poggi, Herb Grays, PA-C  omeprazole (PRILOSEC) 40 MG capsule Take 40 mg  by mouth daily.    [provider]  fluticasone (FLONASE) 50 MCG/ACT nasal spray Place 2 sprays into both nostrils daily. 07/26/18 04/15/19  Domenick Gong, MD  phentermine (ADIPEX-P) 37.5 MG tablet TK ONE T PO QAM B BRE 03/04/17 07/06/20  [provider]  traZODone (DESYREL) 50 MG tablet 1-3 tabs po qhs prn insomnia 06/19/18 05/11/19  [provider]    Family History Family History  Problem Relation Age of Onset   ALS Paternal Aunt    Lung cancer Paternal Grandfather    Hypertension Mother    Breast cancer Father     Social History Social History   Tobacco Use   Smoking status: Every Day    Packs/day: 0.50    Years: 10.00    Total pack years: 5.00    Types: Cigarettes   Smokeless tobacco: Never  Vaping Use   Vaping Use: Never used  Substance Use Topics   Alcohol use: Yes    Comment: occasional   Drug use: No     Allergies   Latex   Review of  Systems Review of Systems  Constitutional:  Positive for activity change. Negative for appetite change, chills, diaphoresis, fatigue and fever.  Respiratory:  Negative for chest tightness and shortness of breath.   Gastrointestinal:  Negative for abdominal pain, nausea and vomiting.  Genitourinary:  Negative for decreased urine volume, difficulty urinating, dysuria, flank pain and hematuria.  Musculoskeletal:  Positive for arthralgias, back pain, gait problem and myalgias. Negative for neck pain and neck stiffness.  Skin:  Negative for color change and rash.  Allergic/Immunologic: Negative for environmental allergies, food allergies and immunocompromised state.  Neurological:  Negative for dizziness, tremors, seizures, syncope, speech difficulty, light-headedness and numbness.  Hematological:  Negative for adenopathy. Does not bruise/bleed easily.     Physical Exam Triage Vital Signs ED Triage Vitals  Enc Vitals Group     BP 05/28/22 1609 (!) 141/93     Pulse Rate 05/28/22 1609 62     Resp --      Temp 05/28/22 1609 98.2 F (36.8 C)     Temp Source 05/28/22 1609 Oral     SpO2 05/28/22 1609 97 %     Weight 05/28/22 1607 265 lb (120.2 kg)     Height 05/28/22 1607 5\' 2"  (1.575 m)     Head Circumference --      Peak Flow --      Pain Score 05/28/22 1607 8     Pain Loc --      Pain Edu? --      Excl. in [place]? --    No data found.  Updated Vital Signs BP (!) 141/93 (BP Location: Left Arm)   Pulse 62   Temp 98.2 F (36.8 C) (Oral)   Ht 5\' 2"  (1.575 m)   Wt 265 lb (120.2 kg)   LMP 05/06/2022 (Exact Date)   SpO2 97%   BMI 48.47 kg/m   Visual Acuity Right Eye Distance:   Left Eye Distance:   Bilateral Distance:    Right Eye Near:   Left Eye Near:    Bilateral Near:     Physical Exam Vitals and nursing note reviewed.  Constitutional:      General: She is awake. She is not in acute distress.    Appearance: She is well-developed and overweight.     Comments: She is  sitting in the exam chair in no acute distress but appears uncomfortable due to pain and  has a lot of difficulty in changing positions due to pain.   HENT:     Head: Normocephalic and atraumatic.     Right Ear: Hearing normal.     Left Ear: Hearing normal.  Eyes:     Extraocular Movements: Extraocular movements intact.     Conjunctiva/sclera: Conjunctivae normal.  Cardiovascular:     Rate and Rhythm: Normal rate and regular rhythm.     Heart sounds: Normal heart sounds. No murmur heard. Pulmonary:     Effort: Pulmonary effort is normal. No respiratory distress.     Breath sounds: Normal breath sounds and air entry. No decreased air movement. No decreased breath sounds, wheezing, rhonchi or rales.     Comments: Respiratory rate = 16.  Musculoskeletal:        General: Tenderness present.     Cervical back: Normal, normal range of motion and neck supple. No tenderness.     Thoracic back: No swelling, spasms or tenderness. Normal range of motion.     Lumbar back: Spasms present. No swelling. Tenderness: slight left lower.Decreased range of motion. Positive left straight leg raise test. Negative right straight leg raise test.       Back:     Comments: Decreased range of motion of lower back, especially with flexion and full extension. Pain with any movement, especially changing positions from sitting to standing- needed assistance holding her up when standing. Minimal tenderness present on left lower lumbar area. No tenderness or pain on right area. A few muscle spasms felt in lumbar region. No redness or rash. No neuro deficits noted. Good distal pulses and sensation.   Skin:    General: Skin is warm and dry.     Capillary Refill: Capillary refill takes less than 2 seconds.     Findings: No erythema or rash.  Neurological:     General: No focal deficit present.     Mental Status: She is alert and oriented to person, place, and time.     Sensory: Sensation is intact. No sensory deficit.      Motor: Motor function is intact.     Gait: Gait abnormal.     Deep Tendon Reflexes: Reflexes are normal and symmetric.     Comments: Difficulty with ambulation due to pain but no distinct motor function deficiencies.   Psychiatric:        Attention and Perception: Attention normal.        Mood and Affect: Mood normal.        Speech: Speech normal.        Behavior: Behavior is slowed. Behavior is cooperative.        Thought Content: Thought content normal.        Cognition and Memory: Cognition normal.      UC Treatments / Results  Labs (all labs ordered are listed, but only abnormal results are displayed) Labs Reviewed - No data to display  EKG   Radiology No results found.  Procedures Procedures (including critical care time)  Medications Ordered in UC Medications  ketorolac (TORADOL) injection 60 mg (60 mg Intramuscular Given 05/28/22 1659)    Initial Impression / Assessment and Plan / UC Course  I have reviewed the triage vital signs and the nursing notes.  Pertinent labs & imaging results that were available during my care of the patient were reviewed by me and considered in my medical decision making (see chart for details).     No distinct red flags indicating imaging is needed  at this time. Discussed with patient that she appears to have a lumbar muscle strain. Since Toradol has been helpful in the past, will give Toradol 60mg  IM now to help with pain and inflammation. Will trial Norflex 100mg  every 12 hours as needed . May continue Mobic as directed. May take Ultram 50mg  at night that she has at home to help with pain and for sleep. Recommend apply warm compresses to area for comfort and alternate with cool compresses. Recommend follow-up with her PCP or Orthopedic in 3 to 4 days if not improving or sooner if worsening.  Final Clinical Impressions(s) / UC Diagnoses   Final diagnoses:  Acute left-sided low back pain without sciatica  Spasm of muscle of lower back      Discharge Instructions      You were given a shot of Toradol 60mg  today to help with pain and inflammation in your back. Recommend start Norflex 100mg  every 12 hours as needed for muscle spasms/pain. May take Ultram 50mg  at night to help with severe pain. May apply ice and alternate with heat for comfort. Follow-up in 3 to 4 days if not improving.     ED Prescriptions     Medication Sig Dispense Auth. Provider   orphenadrine (NORFLEX) 100 MG tablet Take 1 tablet (100 mg total) by mouth 2 (two) times daily as needed for muscle spasms. 15 tablet Eh Sesay, , NP      PDMP not reviewed this encounter.   , NP 05/29/22 [id number]

## 2022-05-28 NOTE — Discharge Instructions (Signed)
You were given a shot of Toradol 60mg  today to help with pain and inflammation in your back. Recommend start Norflex 100mg  every 12 hours as needed for muscle spasms/pain. May take Ultram 50mg  at night to help with severe pain. May apply ice and alternate with heat for comfort. Follow-up in 3 to 4 days if not improving.

## 2022-05-29 ENCOUNTER — Emergency Department
Admission: EM | Admit: 2022-05-29 | Discharge: 2022-05-29 | Disposition: A | Discharge status: Home / Self Care | Payer: 59 | Attending: Emergency Medicine | Admitting: Emergency Medicine

## 2022-05-29 ENCOUNTER — Encounter: Payer: Self-pay | Admitting: Medical Oncology

## 2022-05-29 DIAGNOSIS — M545 Low back pain, unspecified: Secondary | ICD-10-CM | POA: Insufficient documentation

## 2022-05-29 LAB — CBC WITH DIFFERENTIAL/PLATELET
Abs Immature Granulocytes: 0.02 10*3/uL (ref 0.00–0.07)
Basophils Absolute: 0 10*3/uL (ref 0.0–0.1)
Basophils Relative: 0 %
Eosinophils Absolute: 0.1 10*3/uL (ref 0.0–0.5)
Eosinophils Relative: 1 %
HCT: 40.9 % (ref 36.0–46.0)
Hemoglobin: 13.4 g/dL (ref 12.0–15.0)
Immature Granulocytes: 0 %
Lymphocytes Relative: 19 %
Lymphs Abs: 1.5 10*3/uL (ref 0.7–4.0)
MCH: 28.8 pg (ref 26.0–34.0)
MCHC: 32.8 g/dL (ref 30.0–36.0)
MCV: 88 fL (ref 80.0–100.0)
Monocytes Absolute: 0.5 10*3/uL (ref 0.1–1.0)
Monocytes Relative: 6 %
Neutro Abs: 6.2 10*3/uL (ref 1.7–7.7)
Neutrophils Relative %: 74 %
Platelets: 178 10*3/uL (ref 150–400)
RBC: 4.65 MIL/uL (ref 3.87–5.11)
RDW: 12.5 % (ref 11.5–15.5)
WBC: 8.3 10*3/uL (ref 4.0–10.5)
nRBC: 0 % (ref 0.0–0.2)

## 2022-05-29 LAB — COMPREHENSIVE METABOLIC PANEL
ALT: 36 U/L (ref 0–44)
AST: 33 U/L (ref 15–41)
Albumin: 3.7 g/dL (ref 3.5–5.0)
Alkaline Phosphatase: 65 U/L (ref 38–126)
Anion gap: 6 (ref 5–15)
BUN: 10 mg/dL (ref 6–20)
CO2: 25 mmol/L (ref 22–32)
Calcium: 8.7 mg/dL — ABNORMAL LOW (ref 8.9–10.3)
Chloride: 109 mmol/L (ref 98–111)
Creatinine, Ser: 0.65 mg/dL (ref 0.44–1.00)
GFR, Estimated: >60 mL/min (ref >60)
Glucose, Bld: 154 mg/dL — ABNORMAL HIGH (ref 70–99)
Potassium: 3.7 mmol/L (ref 3.5–5.1)
Sodium: 140 mmol/L (ref 135–145)
Total Bilirubin: 0.7 mg/dL (ref 0.3–1.2)
Total Protein: 6.9 g/dL (ref 6.5–8.1)

## 2022-05-29 LAB — URINALYSIS, ROUTINE W REFLEX MICROSCOPIC
Bilirubin Urine: NEGATIVE
Glucose, UA: NEGATIVE mg/dL
Hgb urine dipstick: NEGATIVE
Ketones, ur: NEGATIVE mg/dL
Leukocytes,Ua: NEGATIVE
Nitrite: NEGATIVE
Protein, ur: NEGATIVE mg/dL
Specific Gravity, Urine: 1.025 (ref 1.005–1.030)
pH: 6 (ref 5.0–8.0)

## 2022-05-29 LAB — PREGNANCY, URINE: Preg Test, Ur: NEGATIVE

## 2022-05-29 MED ORDER — LIDOCAINE 5 % EX PTCH: 1.0000 | MEDICATED_PATCH | Freq: Two times a day (BID) | CUTANEOUS | 0 refills | Status: DC

## 2022-05-29 MED ORDER — ACETAMINOPHEN 325 MG PO TABS
650.0000 mg | ORAL_TABLET | Freq: Once | ORAL | Status: AC
  Administered 2022-05-29: 650 mg via ORAL
  Filled 2022-05-29: qty 2

## 2022-05-29 MED ORDER — LIDOCAINE 5 % EX PTCH
1.0000 | MEDICATED_PATCH | CUTANEOUS | Status: DC
  Administered 2022-05-29: 1 via TRANSDERMAL
  Filled 2022-05-29: qty 1

## 2022-05-29 MED ORDER — LIDOCAINE 5 % EX PTCH: 1.0000 | MEDICATED_PATCH | Freq: Two times a day (BID) | CUTANEOUS | 0 refills | Status: AC

## 2022-05-29 NOTE — ED Triage Notes (Signed)
Pt reports left lower back pain since Sunday. PT denies injury.

## 2022-05-29 NOTE — Discharge Instructions (Signed)
Your blood work and urine were without acute findings. Please return to the emergency department for any new, worsening, or changing symptoms or other concerns including weakness in your legs, urinary or stool incontinence or retention, numbness or tingling in your extremities/buttocks/groin, fevers, or any other concerns or change in symptoms.

## 2022-05-29 NOTE — ED Provider Notes (Signed)
[facility] Provider Note    Event Date/Time   First MD Initiated Contact with Patient 05/29/22 [phone]     (approximate)   History   Back Pain   HPI  Amber Keller is a 36 y.o. female who presents today for evaluation of left sided low back pain.  Patient reports that this began a few days ago.  She reports that she has had something similar in the past.  She denies radiation of pain into her abdomen or lower extremities.  She reports that her pain is worse with palpation and improved with rest.  No fevers or chills.  No known injuries.  No dysuria or hematuria.  Patient Active Problem List   Diagnosis Date Noted   PCOS (polycystic ovarian syndrome)    Prediabetes    Obesity, Class III, BMI 40-49.9 (morbid obesity) (HCC)           Physical Exam   Triage Vital Signs: ED Triage Vitals  Enc Vitals Group     BP 05/29/22 0941 (!) 147/99     Pulse Rate 05/29/22 0941 70     Resp 05/29/22 0941 16     Temp 05/29/22 0941 98.1 F (36.7 C)     Temp Source 05/29/22 0941 Oral     SpO2 05/29/22 0941 97 %     Weight 05/29/22 0940 265 lb (120.2 kg)     Height 05/29/22 0940 5\' 2"  (1.575 m)     Head Circumference --      Peak Flow --      Pain Score 05/29/22 0940 10     Pain Loc --      Pain Edu? --      Excl. in GC? --     Most recent vital signs: Vitals:   05/29/22 0941  BP: (!) 147/99  Pulse: 70  Resp: 16  Temp: 98.1 F (36.7 C)  SpO2: 97%    Physical Exam Vitals and nursing note reviewed.  Constitutional:      General: Awake and alert. No acute distress.    Appearance: Normal appearance. The patient is overweight.  HENT:     Head: Normocephalic and atraumatic.     Mouth: Mucous membranes are moist.  Eyes:     General: PERRL. Normal EOMs        Right eye: No discharge.        Left eye: No discharge.     Conjunctiva/sclera: Conjunctivae normal.  Cardiovascular:     Rate and Rhythm: Normal rate and regular rhythm.     Pulses:  Normal pulses.     Heart sounds: Normal heart sounds Pulmonary:     Effort: Pulmonary effort is normal. No respiratory distress.     Breath sounds: Normal breath sounds.  Abdominal:     Abdomen is soft. There is no abdominal tenderness. No rebound or guarding. No distention.  No CVA tenderness. Musculoskeletal:        General: No swelling. Normal range of motion.     Cervical back: Normal range of motion and neck supple.  Back: No midline tenderness.  Tenderness palpation to left lumbar paraspinal muscle tenderness.  Strength and sensation 5/5 to bilateral lower extremities. Normal great toe extension against resistance. Normal sensation throughout feet. Normal patellar reflexes. Negative SLR and opposite SLR bilaterally. Negative FABER test Skin:    General: Skin is warm and dry.     Capillary Refill: Capillary refill takes less than 2 seconds.  Findings: No rash.  Neurological:     Mental Status: The patient is awake and alert.      ED Results / Procedures / Treatments   Labs (all labs ordered are listed, but only abnormal results are displayed) Labs Reviewed  COMPREHENSIVE METABOLIC PANEL - Abnormal; Notable for the following components:      Result Value   Glucose, Bld 154 (*)    Calcium 8.7 (*)    All other components within normal limits  URINALYSIS, ROUTINE W REFLEX MICROSCOPIC - Abnormal; Notable for the following components:   Color, Urine YELLOW (*)    APPearance HAZY (*)    All other components within normal limits  CBC WITH DIFFERENTIAL/PLATELET  PREGNANCY, URINE     EKG     RADIOLOGY     PROCEDURES:  Critical Care performed:   Procedures   MEDICATIONS ORDERED IN ED: Medications  lidocaine (LIDODERM) 5 % 1 patch (1 patch Transdermal Patch Applied 05/29/22 1013)  acetaminophen (TYLENOL) tablet 650 mg (650 mg Oral Given 05/29/22 1013)     IMPRESSION / MDM / ASSESSMENT AND PLAN / ED COURSE  I reviewed the triage vital signs and the nursing  notes.   Differential diagnosis includes, but is not limited to, low back pain, lumbar spasm, lumbar strain, pyelonephritis, nephrolithiasis, UTI.  Patient is awake and alert, hemodynamically stable and afebrile.  No radicular symptoms, no weakness in lower extremities.  No abdominal pain or tenderness.  Urinalysis demonstrates no evidence of infection or blood.  Labs are normal.  Patient wishes symptomatically with improvement of her symptoms.  Symptoms and exam most consistent with a lumbar musculoskeletal etiology.  We discussed strict return precautions and the importance of close outpatient follow-up.  Patient understands and agrees with plan.  Discharged in stable condition.   Patient's presentation is most consistent with acute complicated illness / injury requiring diagnostic workup.       FINAL CLINICAL IMPRESSION(S) / ED DIAGNOSES   Final diagnoses:  Acute left-sided low back pain without sciatica     Rx / DC Orders   ED Discharge Orders          Ordered    lidocaine (LIDODERM) 5 %  Every 12 hours,   Status:  Discontinued        05/29/22 1109    lidocaine (LIDODERM) 5 %  Every 12 hours        05/29/22 1110             Note:  This document was prepared using Dragon voice recognition software and may include unintentional dictation errors.   Keturah Shavers 05/29/22 1153    Dionne Bucy, MD 05/29/22 1517

## 2022-05-29 NOTE — ED Notes (Signed)
Pt c/o LL back pain radiating into L hip.  Denies injury.  Hx of sciatica but sts "it feels different."  Pt was given Toradol at [facility] yesterday but sts no relief.  Pt denies GU and GI symptoms.

## 2022-06-25 DIAGNOSIS — M5416 Radiculopathy, lumbar region: Secondary | ICD-10-CM | POA: Diagnosis not present

## 2022-06-25 DIAGNOSIS — G8929 Other chronic pain: Secondary | ICD-10-CM | POA: Diagnosis not present

## 2022-06-25 DIAGNOSIS — M545 Low back pain, unspecified: Secondary | ICD-10-CM | POA: Diagnosis not present

## 2022-06-25 DIAGNOSIS — M5136 Other intervertebral disc degeneration, lumbar region: Secondary | ICD-10-CM | POA: Diagnosis not present

## 2022-07-05 ENCOUNTER — Ambulatory Visit
Admission: EM | Admit: 2022-07-05 | Discharge: 2022-07-05 | Disposition: A | Discharge status: Home / Self Care | Payer: 59 | Attending: Physician Assistant | Admitting: Physician Assistant

## 2022-07-05 DIAGNOSIS — B3731 Acute candidiasis of vulva and vagina: Secondary | ICD-10-CM | POA: Insufficient documentation

## 2022-07-05 DIAGNOSIS — N760 Acute vaginitis: Secondary | ICD-10-CM | POA: Diagnosis not present

## 2022-07-05 DIAGNOSIS — B9689 Other specified bacterial agents as the cause of diseases classified elsewhere: Secondary | ICD-10-CM | POA: Diagnosis not present

## 2022-07-05 LAB — URINALYSIS, ROUTINE W REFLEX MICROSCOPIC
Bilirubin Urine: NEGATIVE
Glucose, UA: 100 mg/dL — AB
Hgb urine dipstick: NEGATIVE
Ketones, ur: NEGATIVE mg/dL
Nitrite: NEGATIVE
Protein, ur: NEGATIVE mg/dL
Specific Gravity, Urine: 1.025 (ref 1.005–1.030)
pH: 7 (ref 5.0–8.0)

## 2022-07-05 LAB — URINALYSIS, MICROSCOPIC (REFLEX)

## 2022-07-05 LAB — WET PREP, GENITAL
Sperm: NONE SEEN
Trich, Wet Prep: NONE SEEN
WBC, Wet Prep HPF POC: <10 — AB (ref <10)

## 2022-07-05 LAB — PREGNANCY, URINE: Preg Test, Ur: NEGATIVE

## 2022-07-05 MED ORDER — FLUCONAZOLE 150 MG PO TABS: 150.0000 mg | ORAL_TABLET | Freq: Every day | ORAL | 0 refills | Status: AC

## 2022-07-05 MED ORDER — METRONIDAZOLE 0.75 % VA GEL: 1.0000 | Freq: Every day | VAGINAL | 0 refills | Status: DC

## 2022-07-05 NOTE — ED Provider Notes (Signed)
[facility]    CSN: [id number] Arrival date & time: 07/05/22  1856      History   Chief Complaint Chief Complaint  Patient presents with   Vaginal Itching    Entered by patient    HPI Amber Keller is a 36 y.o. female.   Patient presents with urinary frequency, vaginal itching and Teniyah Seivert vaginal discharge for 5 days.  Has not attempted treatment of symptoms.  Last menstrual period July 31, no birth control, menses is irregular.  Sexually active but denies concern for STD.  Denies hematuria, dysuria, lower abdominal pain or pressure, flank pain, fever, chills.   Past Medical History:  Diagnosis Date   Anxiety    COVID-19 2021   Depression    Genital herpes    Obesity, Class III, BMI 40-49.9 (morbid obesity) (HCC)    PCOS (polycystic ovarian syndrome)    Prediabetes     Patient Active Problem List   Diagnosis Date Noted   PCOS (polycystic ovarian syndrome)    Prediabetes    Obesity, Class III, BMI 40-49.9 (morbid obesity) (HCC)     Past Surgical History:  Procedure Laterality Date   GANGLION CYST EXCISION Right 03/14/2021   Procedure: EXCISION OF RIGHT VOLAR CARPAL GANGLION;  Surgeon: Christena Flake, MD;  Location: [facility] ORS;  Service: Orthopedics;  Laterality: Right;   KNEE SURGERY Left     OB History     Gravida  0   Para  0   Term  0   Preterm  0   AB  0   Living  0      SAB  0   IAB  0   Ectopic  0   Multiple  0   Live Births  0            Home Medications    Prior to Admission medications   Medication Sig Start Date End Date Taking? Authorizing Provider  omeprazole (PRILOSEC) 40 MG capsule Take 40 mg by mouth daily.   Yes [provider]  traMADol (ULTRAM) 50 MG tablet Take 1 tablet (50 mg total) by mouth every 6 (six) hours as needed. 11/01/21  Yes Fisher, Roselyn Bering, PA-C  albuterol (VENTOLIN HFA) 108 (90 Base) MCG/ACT inhaler Inhale 1-2 puffs into the lungs every 6 (six) hours as needed for wheezing or  shortness of breath. 03/17/22   Eusebio Friendly B, PA-C  cyanocobalamin (,VITAMIN B-12,) 1000 MCG/ML injection Inject 1,000 mcg into the muscle once a week.    [provider]  meloxicam (MOBIC) 15 MG tablet Take 1 tablet (15 mg total) by mouth daily. 11/01/21 11/01/22  Fisher, Roselyn Bering, PA-C  orphenadrine (NORFLEX) 100 MG tablet Take 1 tablet (100 mg total) by mouth 2 (two) times daily as needed for muscle spasms. 05/28/22   Sudie Grumbling, NP  fluticasone (FLONASE) 50 MCG/ACT nasal spray Place 2 sprays into both nostrils daily. 07/26/18 04/15/19  Domenick Gong, MD  phentermine (ADIPEX-P) 37.5 MG tablet TK ONE T PO QAM B BRE 03/04/17 07/06/20  [provider]  traZODone (DESYREL) 50 MG tablet 1-3 tabs po qhs prn insomnia 06/19/18 05/11/19  [provider]    Family History Family History  Problem Relation Age of Onset   ALS Paternal Aunt    Lung cancer Paternal Grandfather    Hypertension Mother    Breast cancer Father     Social History Social History   Tobacco Use   Smoking status: Every Day  Packs/day: 0.50    Years: 10.00    Total pack years: 5.00    Types: Cigarettes   Smokeless tobacco: Never  Vaping Use   Vaping Use: Never used  Substance Use Topics   Alcohol use: Yes    Comment: occasional   Drug use: No     Allergies   Latex   Review of Systems Review of Systems  Constitutional: Negative.   Respiratory: Negative.    Cardiovascular: Negative.   Gastrointestinal: Negative.   Genitourinary:  Positive for frequency, vaginal discharge and vaginal pain. Negative for decreased urine volume, difficulty urinating, dyspareunia, dysuria, enuresis, flank pain, genital sores, hematuria, menstrual problem, pelvic pain, urgency and vaginal bleeding.  Skin: Negative.   Neurological: Negative.      Physical Exam Triage Vital Signs ED Triage Vitals  Enc Vitals Group     BP 07/05/22 1913 (!) 149/88     Pulse Rate 07/05/22 1913 78     Resp  07/05/22 1913 18     Temp 07/05/22 1913 98.4 F (36.9 C)     Temp Source 07/05/22 1913 Oral     SpO2 07/05/22 1913 99 %     Weight 07/05/22 1911 260 lb (117.9 kg)     Height 07/05/22 1911 5\' 2"  (1.575 m)     Head Circumference --      Peak Flow --      Pain Score 07/05/22 1911 0     Pain Loc --      Pain Edu? --      Excl. in [place]? --    No data found.  Updated Vital Signs BP (!) 149/88 (BP Location: Left Arm)   Pulse 78   Temp 98.4 F (36.9 C) (Oral)   Resp 18   Ht 5\' 2"  (1.575 m)   Wt 260 lb (117.9 kg)   LMP 05/11/2022   SpO2 99%   BMI 47.55 kg/m   Visual Acuity Right Eye Distance:   Left Eye Distance:   Bilateral Distance:    Right Eye Near:   Left Eye Near:    Bilateral Near:     Physical Exam Constitutional:      Appearance: Normal appearance.  Eyes:     Extraocular Movements: Extraocular movements intact.  Pulmonary:     Effort: Pulmonary effort is normal.  Abdominal:     General: Abdomen is flat. Bowel sounds are normal.     Palpations: Abdomen is soft.     Tenderness: There is abdominal tenderness in the suprapubic area.  Genitourinary:    Comments: Deferred Neurological:     Mental Status: She is alert and oriented to person, place, and time. Mental status is at baseline.  Psychiatric:        Mood and Affect: Mood normal.        Behavior: Behavior normal.      UC Treatments / Results  Labs (all labs ordered are listed, but only abnormal results are displayed) Labs Reviewed  WET PREP, GENITAL - Abnormal; Notable for the following components:      Result Value   Yeast Wet Prep HPF POC PRESENT (*)    Clue Cells Wet Prep HPF POC PRESENT (*)    WBC, Wet Prep HPF POC <10 (*)    All other components within normal limits  URINALYSIS, ROUTINE W REFLEX MICROSCOPIC    EKG   Radiology No results found.  Procedures Procedures (including critical care time)  Medications Ordered in UC Medications - No data  to display  Initial Impression /  Assessment and Plan / UC Course  I have reviewed the triage vital signs and the nursing notes.  Pertinent labs & imaging results that were available during my care of the patient were reviewed by me and considered in my medical decision making (see chart for details).  Bacterial vaginosis and vaginal yeast infection  Confirmed via wet prep, negative for trichomoniasis, urinalysis negative, urine pregnancy negative, discussed with patient, prescribed MetroGel and Diflucan, discussed administration, declined STI testing, may follow-up with urgent care as needed if symptoms persist or worsen Final Clinical Impressions(s) / UC Diagnoses   Final diagnoses:  None   Discharge Instructions   None    ED Prescriptions   None    PDMP not reviewed this encounter.   Valinda Hoar, NP 07/05/22 1947

## 2022-07-05 NOTE — Discharge Instructions (Addendum)
Today you are being treated for  Bacterial vaginosis and for yeast, wet prep negative for trichomoniasis, urinalysis negative for bladder infection  Urine pregnancy test negative  Take MetroGel every night before bed for 7 days  Take diflucan 150 mg once, then take second tablet as completion of MetroGel suppository  Bacterial vaginosis which results from an overgrowth of one on several organisms that are normally present in your vagina. Vaginosis is an inflammation of the vagina that can result in discharge, itching and pain.  Yeast infections which are caused by a naturally occurring fungus called candida. Vaginosis is an inflammation of the vagina that can result in discharge, itching and pain. The cause is usually a change in the normal balance of vaginal bacteria or an infection. Vaginosis can also result from reduced estrogen levels after menopause.  In addition: Avoid baths, hot tubs and whirlpool spas.  Don't use scented or harsh soaps Avoid irritants. These include scented tampons and pads. Wipe from front to back after using the toilet. Don't douche. Your vagina doesn't require cleansing other than normal bathing.  Use a condom.  Wear cotton underwear, this fabric absorbs some moisture.

## 2022-07-05 NOTE — ED Triage Notes (Signed)
Pt c/o vaginal itching and discharge and urinary frequency x5days.   Pt states that she is not worried about any STDS.   Pt asks for a prescription for Omeprazole 40mg .

## 2022-08-05 ENCOUNTER — Ambulatory Visit (INDEPENDENT_AMBULATORY_CARE_PROVIDER_SITE_OTHER): Payer: 59

## 2022-08-05 ENCOUNTER — Ambulatory Visit
Admission: EM | Admit: 2022-08-05 | Discharge: 2022-08-05 | Disposition: A | Discharge status: Home / Self Care | Payer: 59 | Attending: Emergency Medicine | Admitting: Emergency Medicine

## 2022-08-05 ENCOUNTER — Encounter: Payer: Self-pay | Admitting: Emergency Medicine

## 2022-08-05 DIAGNOSIS — K219 Gastro-esophageal reflux disease without esophagitis: Secondary | ICD-10-CM | POA: Diagnosis not present

## 2022-08-05 DIAGNOSIS — R051 Acute cough: Secondary | ICD-10-CM

## 2022-08-05 DIAGNOSIS — R059 Cough, unspecified: Secondary | ICD-10-CM

## 2022-08-05 MED ORDER — BENZONATATE 100 MG PO CAPS: 200.0000 mg | ORAL_CAPSULE | Freq: Three times a day (TID) | ORAL | 0 refills | Status: DC

## 2022-08-05 MED ORDER — PROMETHAZINE-DM 6.25-15 MG/5ML PO SYRP: 5.0000 mL | ORAL_SOLUTION | Freq: Four times a day (QID) | ORAL | 0 refills | Status: DC | PRN

## 2022-08-05 MED ORDER — OMEPRAZOLE 40 MG PO CPDR: 40.0000 mg | DELAYED_RELEASE_CAPSULE | Freq: Every day | ORAL | 0 refills | Status: DC

## 2022-08-05 NOTE — ED Provider Notes (Signed)
[facility]    CSN: [id number] Arrival date & time: 08/05/22  1417      History   Chief Complaint Chief Complaint  Patient presents with   Cough    HPI Amber Keller is a 36 y.o. female.   HPI  36 year old female here for evaluation of cough.  The patient reports that for the last month she has been experiencing a cough that is usually worse in the mornings.  Occasionally she will bring up some green sputum but for the most part it is nonproductive.  This is not associate with any runny nose, nasal congestion, shortness of breath, or wheezing.  She has no history of asthma.  She denies fever.  She is a smoker and states that she is smoking a pack a day and has been for the last several years.  Patient denies any sore throat or sour taste in her mouth in the mornings.  Past Medical History:  Diagnosis Date   Anxiety    COVID-19 2021   Depression    Genital herpes    Obesity, Class III, BMI 40-49.9 (morbid obesity) (HCC)    PCOS (polycystic ovarian syndrome)    Prediabetes     Patient Active Problem List   Diagnosis Date Noted   PCOS (polycystic ovarian syndrome)    Prediabetes    Obesity, Class III, BMI 40-49.9 (morbid obesity) ([place])     Past Surgical History:  Procedure Laterality Date   GANGLION CYST EXCISION Right 03/14/2021   Procedure: EXCISION OF RIGHT VOLAR CARPAL GANGLION;  Surgeon: Corky Mull, MD;  Location: [facility] ORS;  Service: Orthopedics;  Laterality: Right;   KNEE SURGERY Left     OB History     Gravida  0   Para  0   Term  0   Preterm  0   AB  0   Living  0      SAB  0   IAB  0   Ectopic  0   Multiple  0   Live Births  0            Home Medications    Prior to Admission medications   Medication Sig Start Date End Date Taking? Authorizing Provider  benzonatate (TESSALON) 100 MG capsule Take 2 capsules (200 mg total) by mouth every 8 (eight) hours. 08/05/22  Yes Margarette Canada, NP   promethazine-dextromethorphan (PROMETHAZINE-DM) 6.25-15 MG/5ML syrup Take 5 mLs by mouth 4 (four) times daily as needed. 08/05/22  Yes Margarette Canada, NP  albuterol (VENTOLIN HFA) 108 (90 Base) MCG/ACT inhaler Inhale 1-2 puffs into the lungs every 6 (six) hours as needed for wheezing or shortness of breath. 03/17/22   Laurene Footman B, PA-C  cyanocobalamin (,VITAMIN B-12,) 1000 MCG/ML injection Inject 1,000 mcg into the muscle once a week.    [provider]  meloxicam (MOBIC) 15 MG tablet Take 1 tablet (15 mg total) by mouth daily. 11/01/21 11/01/22  Fisher, Linden Dolin, PA-C  metroNIDAZOLE (METROGEL VAGINAL) 0.75 % vaginal gel Place 1 Applicatorful vaginally at bedtime. 07/05/22   White, Leitha Schuller, NP  omeprazole (PRILOSEC) 40 MG capsule Take 1 capsule (40 mg total) by mouth daily. 08/05/22   Margarette Canada, NP  orphenadrine (NORFLEX) 100 MG tablet Take 1 tablet (100 mg total) by mouth 2 (two) times daily as needed for muscle spasms. 05/28/22   Katy Apo, NP  traMADol (ULTRAM) 50 MG tablet Take 1 tablet (50 mg total) by mouth every 6 (  six) hours as needed. 11/01/21   Fisher, Linden Dolin, PA-C  fluticasone (FLONASE) 50 MCG/ACT nasal spray Place 2 sprays into both nostrils daily. 07/26/18 04/15/19  Melynda Ripple, MD  phentermine (ADIPEX-P) 37.5 MG tablet TK ONE T PO QAM B BRE 03/04/17 07/06/20  [provider]  traZODone (DESYREL) 50 MG tablet 1-3 tabs po qhs prn insomnia 06/19/18 05/11/19  [provider]    Family History Family History  Problem Relation Age of Onset   ALS Paternal 56    Lung cancer Paternal Grandfather    Hypertension Mother    Breast cancer Father     Social History Social History   Tobacco Use   Smoking status: Every Day    Packs/day: 0.50    Years: 10.00    Total pack years: 5.00    Types: Cigarettes   Smokeless tobacco: Never  Vaping Use   Vaping Use: Never used  Substance Use Topics   Alcohol use: Yes    Comment: occasional   Drug use: No      Allergies   Latex   Review of Systems Review of Systems  Constitutional:  Negative for fever.  HENT:  Negative for congestion, ear pain, rhinorrhea and sore throat.   Respiratory:  Positive for cough. Negative for shortness of breath and wheezing.      Physical Exam Triage Vital Signs ED Triage Vitals  Enc Vitals Group     BP 08/05/22 1436 131/81     Pulse Rate 08/05/22 1436 81     Resp --      Temp 08/05/22 1436 98.4 F (36.9 C)     Temp Source 08/05/22 1436 Oral     SpO2 08/05/22 1436 98 %     Weight 08/05/22 1434 261 lb (118.4 kg)     Height 08/05/22 1434 5\' 2"  (1.575 m)     Head Circumference --      Peak Flow --      Pain Score 08/05/22 1434 0     Pain Loc --      Pain Edu? --      Excl. in [place]? --    No data found.  Updated Vital Signs BP 131/81 (BP Location: Left Arm)   Pulse 81   Temp 98.4 F (36.9 C) (Oral)   Ht 5\' 2"  (1.575 m)   Wt 261 lb (118.4 kg)   LMP 06/26/2022 (Exact Date)   SpO2 98%   BMI 47.74 kg/m   Visual Acuity Right Eye Distance:   Left Eye Distance:   Bilateral Distance:    Right Eye Near:   Left Eye Near:    Bilateral Near:     Physical Exam Vitals and nursing note reviewed.  Constitutional:      Appearance: Normal appearance. She is not ill-appearing.  HENT:     Head: Normocephalic and atraumatic.     Right Ear: Tympanic membrane, ear canal and external ear normal. There is no impacted cerumen.     Left Ear: Tympanic membrane, ear canal and external ear normal. There is no impacted cerumen.     Nose: Congestion and rhinorrhea present.     Comments: Nasal mucosa is erythematous and mildly edematous.  No appreciable rhinorrhea noted.    Mouth/Throat:     Mouth: Mucous membranes are moist.     Pharynx: Oropharynx is clear. No oropharyngeal exudate or posterior oropharyngeal erythema.  Cardiovascular:     Rate and Rhythm: Normal rate and regular rhythm.  Pulses: Normal pulses.     Heart sounds: Normal heart sounds.  No murmur heard.    No friction rub. No gallop.  Pulmonary:     Effort: Pulmonary effort is normal.     Breath sounds: Normal breath sounds. No wheezing, rhonchi or rales.  Musculoskeletal:     Cervical back: Normal range of motion and neck supple.  Lymphadenopathy:     Cervical: No cervical adenopathy.  Skin:    General: Skin is warm and dry.     Capillary Refill: Capillary refill takes less than 2 seconds.     Findings: No erythema or rash.  Neurological:     General: No focal deficit present.     Mental Status: She is alert and oriented to person, place, and time.  Psychiatric:        Mood and Affect: Mood normal.        Behavior: Behavior normal.        Thought Content: Thought content normal.        Judgment: Judgment normal.      UC Treatments / Results  Labs (all labs ordered are listed, but only abnormal results are displayed) Labs Reviewed - No data to display  EKG   Radiology DG Chest 2 View  Result Date: 08/05/2022 CLINICAL DATA:  Cough EXAM: CHEST - 2 VIEW COMPARISON:  05/09/20 FINDINGS: No pleural effusion. No pneumothorax. No focal airspace opacity. Normal cardiac and mediastinal contours. No displaced rib fractures. Visualized upper abdomen is unremarkable. IMPRESSION: No radiographic finding to explain cough Electronically Signed   By: Marin Roberts M.D.   On: 08/05/2022 15:29    Procedures Procedures (including critical care time)  Medications Ordered in UC Medications - No data to display  Initial Impression / Assessment and Plan / UC Course  I have reviewed the triage vital signs and the nursing notes.  Pertinent labs & imaging results that were available during my care of the patient were reviewed by me and considered in my medical decision making (see chart for details).   Patient is a nontoxic-appearing 36 year old female here for evaluation of cough this been going on for the past month.  She states that she is coughing all the time and it is  only intermittently productive for a green sputum.  She states that the cough is worse in the morning and she has cough to the point of emesis on a couple of occasions.  She denies any upper respiratory symptoms or fever.  She has no history of asthma.  She is a smoker and smokes a pack a day and has been doing so for last 3 to 4 years.  On exam patient does have erythema and mild edema of her nasal mucosa but no rhinorrhea present on exam.  Oropharyngeal exam is benign.  Cardiopulmonary exam reveals clear lung sounds in all fields.  I suspect that the patient's cough is secondary to her smoking.  I will obtain a chest x-ray to look for any acute cardiopulmonary pathology.  Radiology impression of chest x-ray states no radiographic findings to explain cough.  I will discharge patient home with Tessalon Perles and Promethazine DM cough syrup to help with her cough symptoms.  I am also going to encourage her to quit smoking as I think this is the primary cause of her cough.  Given her smoking history there may be some reflux disease that is contributing and I will also start her on Prilosec.  Final Clinical Impressions(s) / UC  Diagnoses   Final diagnoses:  Acute cough  Gastroesophageal reflux disease without esophagitis     Discharge Instructions      Your chest x-ray today did not demonstrate any evidence of pneumonia that could explain your cough.  I believe that your cough is being driven by 2 things.  The first is your smoking and I recommend that you stop smoking.  The second is you may have some reflux disease and smoking does have a tendency to increase reflux symptoms.  You have been on omeprazole in the past and can have you resume omeprazole 40 mg daily to help cut down on acid production and see if this improves her symptoms.  I am also going to prescribe Ladona Ridgel that she can use every 8 hours during the day as needed for cough.  They will calm down the cough reflex.  You may  experience a numbness to the base of your tongue or metallic taste in mouth, this is normal.  Also giving antihistamine based cough syrup he can use at bedtime.  If your symptoms continue, they worsen, either return for reevaluation or see your primary care provider.     ED Prescriptions     Medication Sig Dispense Auth. Provider   omeprazole (PRILOSEC) 40 MG capsule Take 1 capsule (40 mg total) by mouth daily. 30 capsule Margarette Canada, NP   benzonatate (TESSALON) 100 MG capsule Take 2 capsules (200 mg total) by mouth every 8 (eight) hours. 21 capsule Margarette Canada, NP   promethazine-dextromethorphan (PROMETHAZINE-DM) 6.25-15 MG/5ML syrup Take 5 mLs by mouth 4 (four) times daily as needed. 118 mL Margarette Canada, NP      PDMP not reviewed this encounter.   Margarette Canada, NP 08/05/22 1536

## 2022-08-05 NOTE — ED Triage Notes (Signed)
Pt states she has had this cough x1 month

## 2022-08-05 NOTE — Discharge Instructions (Addendum)
Your chest x-ray today did not demonstrate any evidence of pneumonia that could explain your cough.  I believe that your cough is being driven by 2 things.  The first is your smoking and I recommend that you stop smoking.  The second is you may have some reflux disease and smoking does have a tendency to increase reflux symptoms.  You have been on omeprazole in the past and can have you resume omeprazole 40 mg daily to help cut down on acid production and see if this improves her symptoms.  I am also going to prescribe Ladona Ridgel that she can use every 8 hours during the day as needed for cough.  They will calm down the cough reflex.  You may experience a numbness to the base of your tongue or metallic taste in mouth, this is normal.  Also giving antihistamine based cough syrup he can use at bedtime.  If your symptoms continue, they worsen, either return for reevaluation or see your primary care provider.

## 2022-08-06 ENCOUNTER — Telehealth: Payer: Self-pay | Admitting: Emergency Medicine

## 2022-08-06 MED ORDER — OMEPRAZOLE 40 MG PO CPDR: 40.0000 mg | DELAYED_RELEASE_CAPSULE | Freq: Every day | ORAL | 0 refills | Status: AC

## 2022-08-06 MED ORDER — BENZONATATE 100 MG PO CAPS: 200.0000 mg | ORAL_CAPSULE | Freq: Three times a day (TID) | ORAL | 0 refills | Status: AC

## 2022-08-06 MED ORDER — PROMETHAZINE-DM 6.25-15 MG/5ML PO SYRP: 5.0000 mL | ORAL_SOLUTION | Freq: Four times a day (QID) | ORAL | 0 refills | Status: AC | PRN

## 2022-08-06 NOTE — Telephone Encounter (Signed)
Patient is requesting that [facility] medications [facility] [place] as the [facility] does not accept her insurance.  I reordered the prescriptions and [facility] them [facility] [place].

## 2022-08-20 DIAGNOSIS — R053 Chronic cough: Secondary | ICD-10-CM | POA: Diagnosis not present

## 2022-08-20 DIAGNOSIS — F419 Anxiety disorder, unspecified: Secondary | ICD-10-CM | POA: Diagnosis not present

## 2022-08-20 DIAGNOSIS — R7303 Prediabetes: Secondary | ICD-10-CM | POA: Diagnosis not present

## 2022-08-20 DIAGNOSIS — Z0000 Encounter for general adult medical examination without abnormal findings: Secondary | ICD-10-CM | POA: Diagnosis not present

## 2022-08-20 DIAGNOSIS — F32A Depression, unspecified: Secondary | ICD-10-CM | POA: Diagnosis not present

## 2022-08-22 DIAGNOSIS — R7303 Prediabetes: Secondary | ICD-10-CM | POA: Diagnosis not present

## 2022-08-22 DIAGNOSIS — Z0000 Encounter for general adult medical examination without abnormal findings: Secondary | ICD-10-CM | POA: Diagnosis not present

## 2022-09-29 ENCOUNTER — Ambulatory Visit
Admission: EM | Admit: 2022-09-29 | Discharge: 2022-09-29 | Disposition: A | Discharge status: Home / Self Care | Payer: 59 | Attending: Internal Medicine | Admitting: Internal Medicine

## 2022-09-29 DIAGNOSIS — N760 Acute vaginitis: Secondary | ICD-10-CM | POA: Insufficient documentation

## 2022-09-29 DIAGNOSIS — B9689 Other specified bacterial agents as the cause of diseases classified elsewhere: Secondary | ICD-10-CM | POA: Insufficient documentation

## 2022-09-29 DIAGNOSIS — Z1152 Encounter for screening for COVID-19: Secondary | ICD-10-CM | POA: Diagnosis not present

## 2022-09-29 DIAGNOSIS — B379 Candidiasis, unspecified: Secondary | ICD-10-CM | POA: Insufficient documentation

## 2022-09-29 DIAGNOSIS — J069 Acute upper respiratory infection, unspecified: Secondary | ICD-10-CM | POA: Diagnosis not present

## 2022-09-29 LAB — WET PREP, GENITAL
Sperm: NONE SEEN
Trich, Wet Prep: NONE SEEN
WBC, Wet Prep HPF POC: >10 — AB (ref <10)

## 2022-09-29 LAB — SARS CORONAVIRUS 2 BY RT PCR: SARS Coronavirus 2 by RT PCR: NEGATIVE

## 2022-09-29 LAB — RAPID INFLUENZA A&B ANTIGENS
Influenza A (ARMC): NEGATIVE
Influenza B (ARMC): NEGATIVE

## 2022-09-29 MED ORDER — FLUCONAZOLE 150 MG PO TABS: 150.0000 mg | ORAL_TABLET | ORAL | 0 refills | Status: AC | PRN

## 2022-09-29 MED ORDER — ACETAMINOPHEN 325 MG PO TABS
650.0000 mg | ORAL_TABLET | Freq: Once | ORAL | Status: AC
  Administered 2022-09-29: 650 mg via ORAL

## 2022-09-29 MED ORDER — METRONIDAZOLE 500 MG PO TABS: 500.0000 mg | ORAL_TABLET | Freq: Two times a day (BID) | ORAL | 0 refills | Status: AC

## 2022-09-29 NOTE — Discharge Instructions (Signed)
You were seen for fever/nasal congestion and vaginal discharge/discomfort and are being treated for bacterial vaginosis, yeast infection and a viral upper respiratory infection.   -Your test results during positive for BV and yeast. - Take the antibiotics as prescribed until they're finished. If you think you're having a reaction, stop the medication, take benadryl and go to the nearest urgent care/emergency room. Take a probiotic while taking the antibiotic to decrease the chances of stomach upset.  -You are being tested for COVID and flu.  If 1 of those test returns as positive, your appropriate antiviral medication will be sent to the pharmacy in your chart. -Continue symptomatic care at home: Checking your temperature and treating fever with Tylenol and ibuprofen as needed, staying hydrated, rest, cough medication as needed. --- Take care, Dr. Sharlet Salina, NP-c

## 2022-09-29 NOTE — ED Provider Notes (Signed)
[facility] [place]   Name: Amber Keller DOB: 1986/01/12 MRN: [id number] CSN: [id number] PCP: Pcp, No  Arrival date and time:  09/29/22 1239  Chief Complaint:  Vaginal Itching and Cough   NOTE: Prior to seeing the patient today, I have reviewed the triage nursing documentation and vital signs. Clinical staff has updated patient's PMH/PSHx, current medication list, and drug allergies/intolerances to ensure comprehensive history available to assist in medical decision making.   History:   HPI: Amber Keller is a 36 y.o. female who presents today with complaints of cough, sinus congestion, body aches and chills.  She is also complaining of vaginal itching and discharge.  She states all the symptoms started in the past 24 hours. Her viral symptoms have been progressing throughout the day.  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  She is unaware of what her coworker had.  She has not tried any over-the-counter medications to help with her symptoms. In regards to her vaginal concerns, she was recently diagnosed and treated for bacterial vaginosis in the past month.  She states she completed the metronidazole gel as directed.  She denies any concerns of STIs.  No possibility of pregnancy.   Past Medical History:  Diagnosis Date   Anxiety    COVID-19 2021   Depression    Genital herpes    Obesity, Class III, BMI 40-49.9 (morbid obesity) (HCC)    PCOS (polycystic ovarian syndrome)    Prediabetes     Past Surgical History:  Procedure Laterality Date   GANGLION CYST EXCISION Right 03/14/2021   Procedure: EXCISION OF RIGHT VOLAR CARPAL GANGLION;  Surgeon: Christena Flake, MD;  Location: [facility] ORS;  Service: Orthopedics;  Laterality: Right;   KNEE SURGERY Left     Family History  Problem Relation Age of Onset   ALS Paternal Aunt    Lung cancer Paternal Grandfather    Hypertension Mother    Breast cancer Father     Social History    Tobacco Use   Smoking status: Every Day    Packs/day: 0.50    Years: 10.00    Total pack years: 5.00    Types: Cigarettes   Smokeless tobacco: Never  Vaping Use   Vaping Use: Never used  Substance Use Topics   Alcohol use: Yes    Comment: occasional   Drug use: No    Patient Active Problem List   Diagnosis Date Noted   PCOS (polycystic ovarian syndrome)    Prediabetes    Obesity, Class III, BMI 40-49.9 (morbid obesity) (HCC)     Home Medications:    Current Meds  Medication Sig   omeprazole (PRILOSEC) 40 MG capsule Take 1 capsule (40 mg total) by mouth daily.   promethazine-dextromethorphan (PROMETHAZINE-DM) 6.25-15 MG/5ML syrup Take 5 mLs by mouth 4 (four) times daily as needed.   traMADol (ULTRAM) 50 MG tablet Take 1 tablet (50 mg total) by mouth every 6 (six) hours as needed.    Allergies:   Latex  Review of Systems (ROS): Review of Systems  Constitutional:  Positive for fatigue and fever. Negative for activity change, appetite change, chills and diaphoresis.  HENT:  Positive for congestion, ear pain, postnasal drip, sinus pressure, sinus pain and sore throat. Negative for ear discharge, facial swelling, hearing loss and sneezing.   Eyes:  Negative for pain.  Respiratory:  Positive for cough. Negative for shortness of breath and wheezing.  Gastrointestinal:  Positive for nausea. Negative for abdominal pain, constipation, diarrhea and vomiting.  Genitourinary:  Positive for dysuria and vaginal discharge. Negative for frequency, genital sores and vaginal pain.  Musculoskeletal:  Positive for myalgias.  All other systems reviewed and are negative.    Vital Signs: Today's Vitals   09/29/22 1514 09/29/22 1525  BP:  (!) 148/93  Pulse:  96  Resp:  (!) 40  Temp:  (!) 101.1 F (38.4 C)  TempSrc:  Oral  SpO2:  100%  Weight: 260 lb (117.9 kg)   Height: 5\' 2"  (1.575 m)   PainSc: 8      Physical Exam: Physical Exam Vitals and nursing note reviewed.   Constitutional:      Appearance: Normal appearance. She is ill-appearing.  HENT:     Right Ear: Tympanic membrane normal.     Left Ear: Tympanic membrane normal.     Nose:     Right Sinus: Maxillary sinus tenderness present. No frontal sinus tenderness.     Left Sinus: Maxillary sinus tenderness present. No frontal sinus tenderness.     Mouth/Throat:     Pharynx: Oropharynx is clear. Posterior oropharyngeal erythema present.     Tonsils: No tonsillar exudate.  Cardiovascular:     Rate and Rhythm: Normal rate and regular rhythm.     Pulses: Normal pulses.     Heart sounds: Normal heart sounds.  Pulmonary:     Effort: Pulmonary effort is normal.     Breath sounds: Normal breath sounds.  Abdominal:     General: Abdomen is protuberant.     Tenderness: There is no abdominal tenderness.  Skin:    General: Skin is warm and dry.  Neurological:     General: No focal deficit present.     Mental Status: She is alert and oriented to person, place, and time.  Psychiatric:        Mood and Affect: Mood normal.        Behavior: Behavior normal.      Urgent Care Treatments / Results:   LABS: PLEASE NOTE: all labs that were ordered this encounter are listed, however only abnormal results are displayed. Labs Reviewed  WET PREP, GENITAL - Abnormal; Notable for the following components:      Result Value   Yeast Wet Prep HPF POC PRESENT (*)    Clue Cells Wet Prep HPF POC PRESENT (*)    WBC, Wet Prep HPF POC >10 (*)    All other components within normal limits  SARS CORONAVIRUS 2 BY RT PCR  RAPID INFLUENZA A&B ANTIGENS    EKG: -None  RADIOLOGY: No results found.  PROCEDURES: Procedures  MEDICATIONS RECEIVED THIS VISIT: Medications  acetaminophen (TYLENOL) tablet 650 mg (650 mg Oral Given 09/29/22 1544)    PERTINENT CLINICAL COURSE NOTES/UPDATES:   Initial Impression / Assessment and Plan / Urgent Care Course:  Pertinent labs & imaging results that were available during my  care of the patient were personally reviewed by me and considered in my medical decision making (see lab/imaging section of note for values and interpretations).  Amber Keller is a 36 y.o. female who presents to [facility] today with complaints of vaginal discharge/discomfort, diagnosed with bacterial vaginosis, yeast and viral URI, and treated as such with the medications below. NP and patient reviewed discharge instructions below during visit.   Patient is well appearing overall in clinic today. She does not appear to be in any acute distress. Presenting symptoms (see HPI)  and exam as documented above.   I have reviewed the follow up and strict return precautions for any new or worsening symptoms. Patient is aware of symptoms that would be deemed urgent/emergent, and would thus require further evaluation either here or in the emergency department. At the time of discharge, she verbalized understanding and consent with the discharge plan as it was reviewed with her. All questions were fielded by provider and/or clinic staff prior to patient discharge.    Final Clinical Impressions / Urgent Care Diagnoses:   Final diagnoses:  Yeast infection  BV (bacterial vaginosis)  Viral URI    New Prescriptions:  [place] Controlled Substance Registry consulted? Not Applicable  Meds ordered this encounter  Medications   acetaminophen (TYLENOL) tablet 650 mg   metroNIDAZOLE (FLAGYL) 500 MG tablet    Sig: Take 1 tablet (500 mg total) by mouth 2 (two) times daily.    Dispense:  14 tablet    Refill:  0   fluconazole (DIFLUCAN) 150 MG tablet    Sig: Take 1 tablet (150 mg total) by mouth every 3 (three) days as needed.    Dispense:  2 tablet    Refill:  0      Discharge Instructions      You were seen for fever/nasal congestion and vaginal discharge/discomfort and are being treated for bacterial vaginosis, yeast infection and a viral upper respiratory infection.   -Your test results  during positive for BV and yeast. - Take the antibiotics as prescribed until they're finished. If you think you're having a reaction, stop the medication, take benadryl and go to the nearest urgent care/emergency room. Take a probiotic while taking the antibiotic to decrease the chances of stomach upset.  -You are being tested for COVID and flu.  If 1 of those test returns as positive, your appropriate antiviral medication will be sent to the pharmacy in your chart. -Continue symptomatic care at home: Checking your temperature and treating fever with Tylenol and ibuprofen as needed, staying hydrated, rest, cough medication as needed. --- Take care, Dr. Marland Kitchen, NP-c      Recommended Follow up Care:  Patient encouraged to follow up with the following provider within the specified time frame, or sooner as dictated by the severity of her symptoms. As always, she was instructed that for any urgent/emergent care needs, she should seek care either here or in the emergency department for more immediate evaluation.   Gertie Baron, DNP, NP-c   Gertie Baron, NP 09/29/22 [phone]

## 2022-09-29 NOTE — ED Triage Notes (Signed)
Pt c/o fever, facial and sinus pressure, headache, dry throat, cough, vaginal itching x1day

## 2022-10-01 DIAGNOSIS — R0981 Nasal congestion: Secondary | ICD-10-CM | POA: Diagnosis not present

## 2022-10-01 DIAGNOSIS — J069 Acute upper respiratory infection, unspecified: Secondary | ICD-10-CM | POA: Diagnosis not present

## 2022-10-01 DIAGNOSIS — R058 Other specified cough: Secondary | ICD-10-CM | POA: Diagnosis not present

## 2022-10-01 DIAGNOSIS — B9689 Other specified bacterial agents as the cause of diseases classified elsewhere: Secondary | ICD-10-CM | POA: Diagnosis not present

## 2022-10-01 DIAGNOSIS — N760 Acute vaginitis: Secondary | ICD-10-CM | POA: Diagnosis not present

## 2022-10-01 DIAGNOSIS — J029 Acute pharyngitis, unspecified: Secondary | ICD-10-CM | POA: Diagnosis not present

## 2022-10-01 DIAGNOSIS — J3489 Other specified disorders of nose and nasal sinuses: Secondary | ICD-10-CM | POA: Diagnosis not present

## 2022-10-01 DIAGNOSIS — Z03818 Encounter for observation for suspected exposure to other biological agents ruled out: Secondary | ICD-10-CM | POA: Diagnosis not present

## 2022-10-01 DIAGNOSIS — B379 Candidiasis, unspecified: Secondary | ICD-10-CM | POA: Diagnosis not present

## 2022-10-18 DIAGNOSIS — R058 Other specified cough: Secondary | ICD-10-CM | POA: Diagnosis not present

## 2022-10-18 DIAGNOSIS — R0989 Other specified symptoms and signs involving the circulatory and respiratory systems: Secondary | ICD-10-CM | POA: Diagnosis not present

## 2022-10-18 DIAGNOSIS — J40 Bronchitis, not specified as acute or chronic: Secondary | ICD-10-CM | POA: Diagnosis not present

## 2022-10-18 DIAGNOSIS — R059 Cough, unspecified: Secondary | ICD-10-CM | POA: Diagnosis not present

## 2022-10-30 DIAGNOSIS — D2272 Melanocytic nevi of left lower limb, including hip: Secondary | ICD-10-CM | POA: Diagnosis not present

## 2022-10-30 DIAGNOSIS — L578 Other skin changes due to chronic exposure to nonionizing radiation: Secondary | ICD-10-CM | POA: Diagnosis not present

## 2022-10-30 DIAGNOSIS — D2262 Melanocytic nevi of left upper limb, including shoulder: Secondary | ICD-10-CM | POA: Diagnosis not present

## 2022-10-30 DIAGNOSIS — L538 Other specified erythematous conditions: Secondary | ICD-10-CM | POA: Diagnosis not present

## 2022-10-30 DIAGNOSIS — L309 Dermatitis, unspecified: Secondary | ICD-10-CM | POA: Diagnosis not present

## 2022-10-30 DIAGNOSIS — D485 Neoplasm of uncertain behavior of skin: Secondary | ICD-10-CM | POA: Diagnosis not present

## 2022-10-30 DIAGNOSIS — L918 Other hypertrophic disorders of the skin: Secondary | ICD-10-CM | POA: Diagnosis not present

## 2022-10-30 DIAGNOSIS — D225 Melanocytic nevi of trunk: Secondary | ICD-10-CM | POA: Diagnosis not present

## 2022-10-30 DIAGNOSIS — D2261 Melanocytic nevi of right upper limb, including shoulder: Secondary | ICD-10-CM | POA: Diagnosis not present

## 2022-10-30 DIAGNOSIS — L700 Acne vulgaris: Secondary | ICD-10-CM | POA: Diagnosis not present

## 2022-12-17 IMAGING — MR MR WRIST*R* W/O CM
3 series · 40 of 40 positions shown · non-contrast
Comparison: Plain films right hand 10/21/2011.

CLINICAL DATA: Pain at the distal right thumb radiating into the
wrist and forearm for 1 month. Question de Quervain's tenosynovitis.
No known injury.

EXAM:
MR OF THE RIGHT WRIST WITHOUT CONTRAST
TECHNIQUE: Multiplanar, multisequence MR imaging of the right wrist was
performed. No intravenous contrast was administered.

[Series 6: T2 fat-sat · axial · right · 2.0mm · 0.47mm/px · z∈[-73,-21]mm · 12 of 27 slices shown]
[im 1/27]
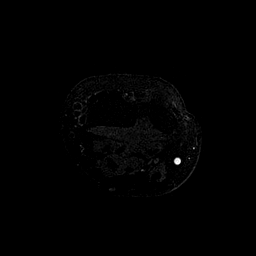
[im 3/27]
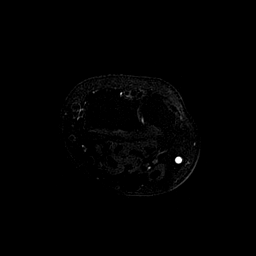
[im 5/27]
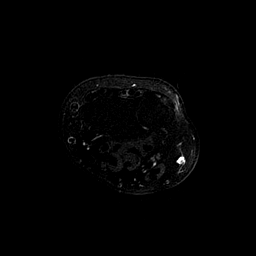
[im 8/27]
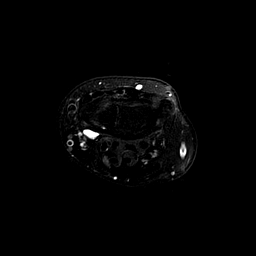
[im 10/27]
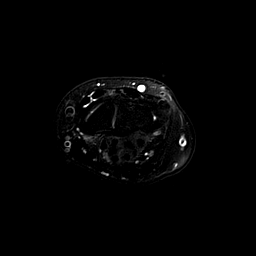
[im 12/27]
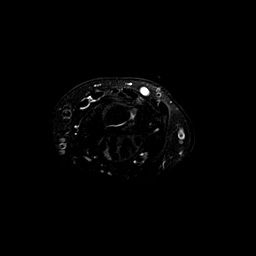
[im 15/27]
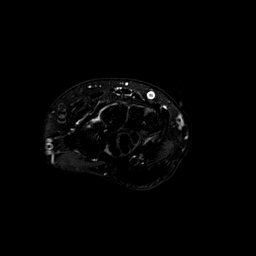
[im 17/27]
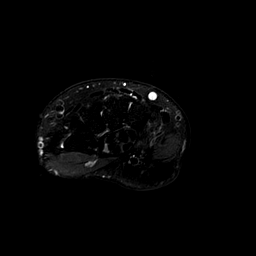
[im 19/27]
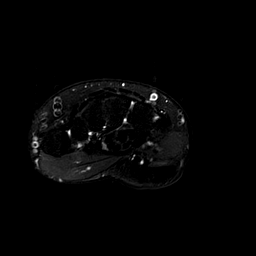
[im 22/27]
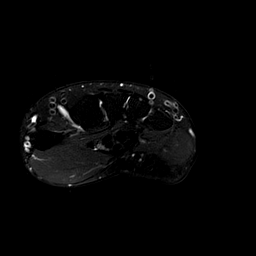
[im 24/27]
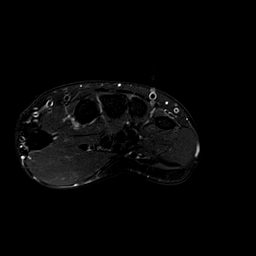
[im 27/27]
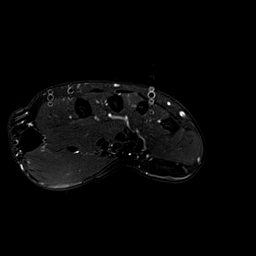

[Series 7: T1 · axial · right · 2.0mm · 0.47mm/px · z∈[-73,-21]mm · 13 of 27 slices shown]
[im 1/27]
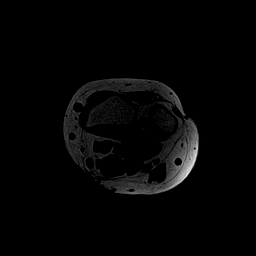
[im 3/27]
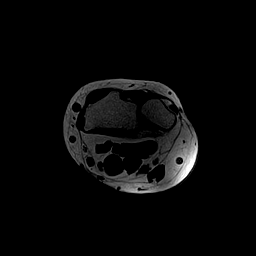
[im 5/27]
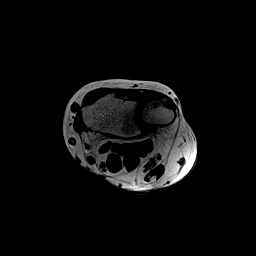
[im 7/27]
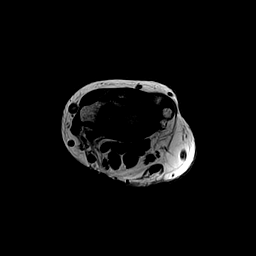
[im 9/27]
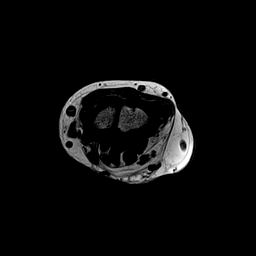
[im 11/27]
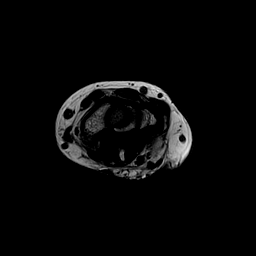
[im 14/27]
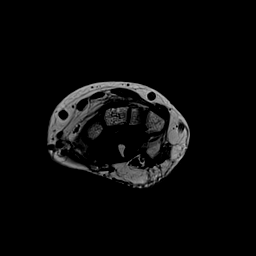
[im 16/27]
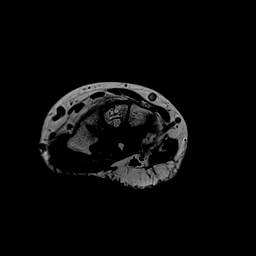
[im 18/27]
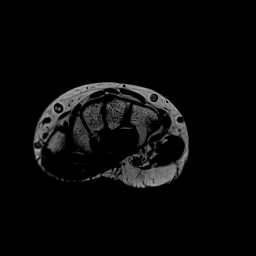
[im 20/27]
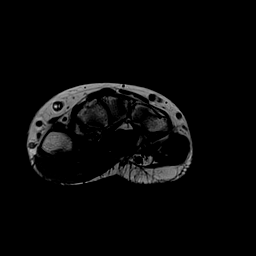
[im 22/27]
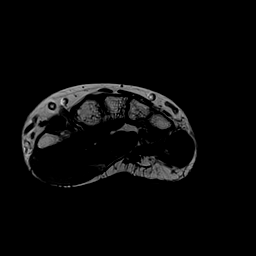
[im 24/27]
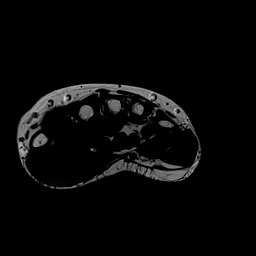
[im 27/27]
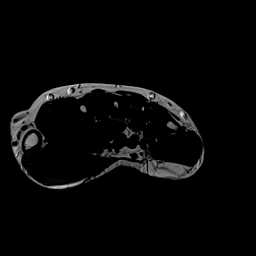

[Series 12: PD fat-sat · sagittal · right · 3.0mm · 0.39mm/px · 15 of 31 slices shown]
[im 1/31]
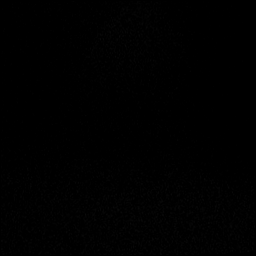
[im 3/31]
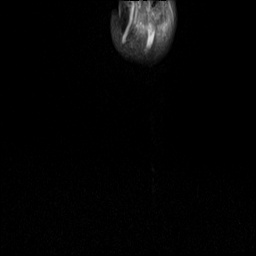
[im 5/31]
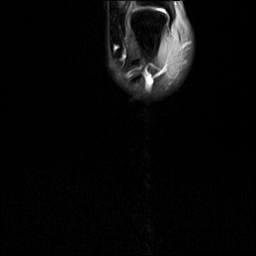
[im 7/31]
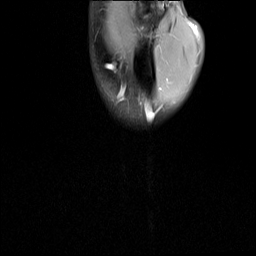
[im 9/31]
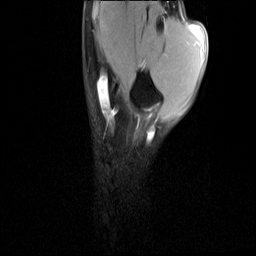
[im 11/31]
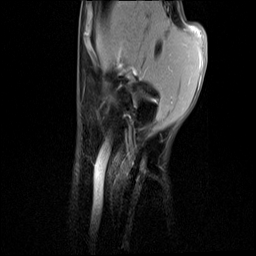
[im 13/31]
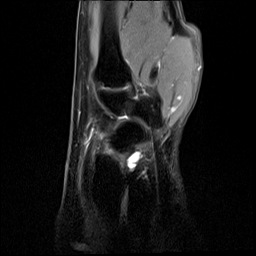
[im 16/31]
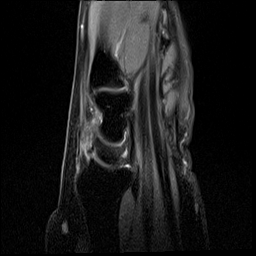
[im 18/31]
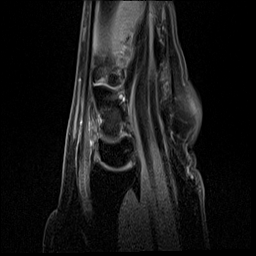
[im 20/31]
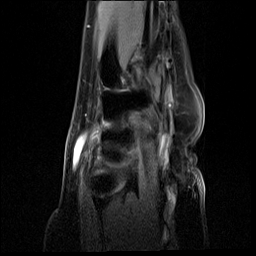
[im 22/31]
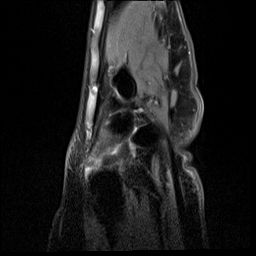
[im 24/31]
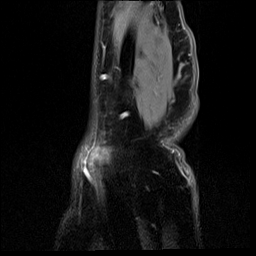
[im 26/31]
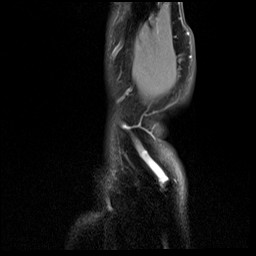
[im 28/31]
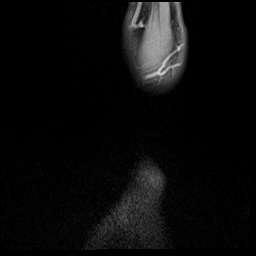
[im 31/31]
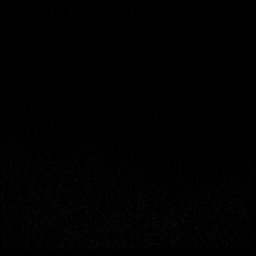

[40 of 40 positions shown; findings below may reference images not displayed]

FINDINGS: Please note this examination is divided across 2 accession numbers
as the first study was incomplete in the patient return for
additional imaging. Only 1 exam was performed.

Ligaments: Intact and normal in appearance.

Triangular fibrocartilage: Intact and normal in appearance.

Tendons: Intact and normal in appearance. Negative for de Quervain
disease.

Carpal tunnel/median nerve: Normal.

Guyon's canal: Normal.

Joint/cartilage: Normal.

Bones/carpal alignment: Normal.

Other: A cystic lesion measuring 0.9 cm transverse by 0.5 cm AP by
0.7 cm craniocaudal is seen in the volar soft tissues of the wrist
and contiguous with the radial styloid and mid scaphoid.
IMPRESSION: Ganglion cyst in the volar soft tissues of the radial wrist as
described above. The examination is otherwise negative. No evidence
of de Quervain's tenosynovitis.

## 2023-02-28 DIAGNOSIS — G8929 Other chronic pain: Secondary | ICD-10-CM | POA: Diagnosis not present

## 2023-02-28 DIAGNOSIS — M545 Low back pain, unspecified: Secondary | ICD-10-CM | POA: Diagnosis not present

## 2023-03-18 DIAGNOSIS — R7303 Prediabetes: Secondary | ICD-10-CM | POA: Diagnosis not present

## 2023-03-18 DIAGNOSIS — K760 Fatty (change of) liver, not elsewhere classified: Secondary | ICD-10-CM | POA: Diagnosis not present

## 2023-03-24 DIAGNOSIS — R7303 Prediabetes: Secondary | ICD-10-CM | POA: Diagnosis not present

## 2023-03-24 DIAGNOSIS — K760 Fatty (change of) liver, not elsewhere classified: Secondary | ICD-10-CM | POA: Diagnosis not present

## 2023-05-05 DIAGNOSIS — M9905 Segmental and somatic dysfunction of pelvic region: Secondary | ICD-10-CM | POA: Diagnosis not present

## 2023-05-05 DIAGNOSIS — M9903 Segmental and somatic dysfunction of lumbar region: Secondary | ICD-10-CM | POA: Diagnosis not present

## 2023-05-05 DIAGNOSIS — M5431 Sciatica, right side: Secondary | ICD-10-CM | POA: Diagnosis not present

## 2023-05-05 DIAGNOSIS — M5432 Sciatica, left side: Secondary | ICD-10-CM | POA: Diagnosis not present

## 2023-05-06 DIAGNOSIS — M9905 Segmental and somatic dysfunction of pelvic region: Secondary | ICD-10-CM | POA: Diagnosis not present

## 2023-05-06 DIAGNOSIS — M5432 Sciatica, left side: Secondary | ICD-10-CM | POA: Diagnosis not present

## 2023-05-06 DIAGNOSIS — M5431 Sciatica, right side: Secondary | ICD-10-CM | POA: Diagnosis not present

## 2023-05-06 DIAGNOSIS — M9903 Segmental and somatic dysfunction of lumbar region: Secondary | ICD-10-CM | POA: Diagnosis not present

## 2023-05-07 DIAGNOSIS — M5431 Sciatica, right side: Secondary | ICD-10-CM | POA: Diagnosis not present

## 2023-05-07 DIAGNOSIS — M9903 Segmental and somatic dysfunction of lumbar region: Secondary | ICD-10-CM | POA: Diagnosis not present

## 2023-05-07 DIAGNOSIS — M9905 Segmental and somatic dysfunction of pelvic region: Secondary | ICD-10-CM | POA: Diagnosis not present

## 2023-05-07 DIAGNOSIS — M5432 Sciatica, left side: Secondary | ICD-10-CM | POA: Diagnosis not present

## 2023-05-08 DIAGNOSIS — M5431 Sciatica, right side: Secondary | ICD-10-CM | POA: Diagnosis not present

## 2023-05-08 DIAGNOSIS — M5432 Sciatica, left side: Secondary | ICD-10-CM | POA: Diagnosis not present

## 2023-05-08 DIAGNOSIS — M9905 Segmental and somatic dysfunction of pelvic region: Secondary | ICD-10-CM | POA: Diagnosis not present

## 2023-05-08 DIAGNOSIS — M9903 Segmental and somatic dysfunction of lumbar region: Secondary | ICD-10-CM | POA: Diagnosis not present

## 2023-05-12 DIAGNOSIS — M5431 Sciatica, right side: Secondary | ICD-10-CM | POA: Diagnosis not present

## 2023-05-12 DIAGNOSIS — M5432 Sciatica, left side: Secondary | ICD-10-CM | POA: Diagnosis not present

## 2023-05-12 DIAGNOSIS — M9903 Segmental and somatic dysfunction of lumbar region: Secondary | ICD-10-CM | POA: Diagnosis not present

## 2023-05-12 DIAGNOSIS — M9905 Segmental and somatic dysfunction of pelvic region: Secondary | ICD-10-CM | POA: Diagnosis not present

## 2023-05-14 DIAGNOSIS — M9905 Segmental and somatic dysfunction of pelvic region: Secondary | ICD-10-CM | POA: Diagnosis not present

## 2023-05-14 DIAGNOSIS — M9903 Segmental and somatic dysfunction of lumbar region: Secondary | ICD-10-CM | POA: Diagnosis not present

## 2023-05-14 DIAGNOSIS — M5432 Sciatica, left side: Secondary | ICD-10-CM | POA: Diagnosis not present

## 2023-05-14 DIAGNOSIS — M5431 Sciatica, right side: Secondary | ICD-10-CM | POA: Diagnosis not present

## 2023-05-15 DIAGNOSIS — M5431 Sciatica, right side: Secondary | ICD-10-CM | POA: Diagnosis not present

## 2023-05-15 DIAGNOSIS — M9903 Segmental and somatic dysfunction of lumbar region: Secondary | ICD-10-CM | POA: Diagnosis not present

## 2023-05-15 DIAGNOSIS — M9905 Segmental and somatic dysfunction of pelvic region: Secondary | ICD-10-CM | POA: Diagnosis not present

## 2023-05-15 DIAGNOSIS — M5432 Sciatica, left side: Secondary | ICD-10-CM | POA: Diagnosis not present

## 2023-05-19 DIAGNOSIS — H524 Presbyopia: Secondary | ICD-10-CM | POA: Diagnosis not present

## 2023-05-19 DIAGNOSIS — H33192 Other retinoschisis and retinal cysts, left eye: Secondary | ICD-10-CM | POA: Diagnosis not present

## 2023-05-19 DIAGNOSIS — M9903 Segmental and somatic dysfunction of lumbar region: Secondary | ICD-10-CM | POA: Diagnosis not present

## 2023-05-19 DIAGNOSIS — R7303 Prediabetes: Secondary | ICD-10-CM | POA: Diagnosis not present

## 2023-05-19 DIAGNOSIS — M5431 Sciatica, right side: Secondary | ICD-10-CM | POA: Diagnosis not present

## 2023-05-19 DIAGNOSIS — M5432 Sciatica, left side: Secondary | ICD-10-CM | POA: Diagnosis not present

## 2023-05-19 DIAGNOSIS — M9905 Segmental and somatic dysfunction of pelvic region: Secondary | ICD-10-CM | POA: Diagnosis not present

## 2023-05-21 DIAGNOSIS — M9905 Segmental and somatic dysfunction of pelvic region: Secondary | ICD-10-CM | POA: Diagnosis not present

## 2023-05-21 DIAGNOSIS — M5432 Sciatica, left side: Secondary | ICD-10-CM | POA: Diagnosis not present

## 2023-05-21 DIAGNOSIS — M5431 Sciatica, right side: Secondary | ICD-10-CM | POA: Diagnosis not present

## 2023-05-21 DIAGNOSIS — M9903 Segmental and somatic dysfunction of lumbar region: Secondary | ICD-10-CM | POA: Diagnosis not present

## 2023-05-23 DIAGNOSIS — M9903 Segmental and somatic dysfunction of lumbar region: Secondary | ICD-10-CM | POA: Diagnosis not present

## 2023-05-23 DIAGNOSIS — M5431 Sciatica, right side: Secondary | ICD-10-CM | POA: Diagnosis not present

## 2023-05-23 DIAGNOSIS — M5432 Sciatica, left side: Secondary | ICD-10-CM | POA: Diagnosis not present

## 2023-05-23 DIAGNOSIS — M9905 Segmental and somatic dysfunction of pelvic region: Secondary | ICD-10-CM | POA: Diagnosis not present

## 2023-05-27 DIAGNOSIS — M9905 Segmental and somatic dysfunction of pelvic region: Secondary | ICD-10-CM | POA: Diagnosis not present

## 2023-05-27 DIAGNOSIS — M5431 Sciatica, right side: Secondary | ICD-10-CM | POA: Diagnosis not present

## 2023-05-27 DIAGNOSIS — M9903 Segmental and somatic dysfunction of lumbar region: Secondary | ICD-10-CM | POA: Diagnosis not present

## 2023-05-27 DIAGNOSIS — M5432 Sciatica, left side: Secondary | ICD-10-CM | POA: Diagnosis not present

## 2023-05-29 DIAGNOSIS — M9903 Segmental and somatic dysfunction of lumbar region: Secondary | ICD-10-CM | POA: Diagnosis not present

## 2023-05-29 DIAGNOSIS — M5431 Sciatica, right side: Secondary | ICD-10-CM | POA: Diagnosis not present

## 2023-05-29 DIAGNOSIS — M5432 Sciatica, left side: Secondary | ICD-10-CM | POA: Diagnosis not present

## 2023-05-29 DIAGNOSIS — M9905 Segmental and somatic dysfunction of pelvic region: Secondary | ICD-10-CM | POA: Diagnosis not present

## 2023-06-03 DIAGNOSIS — M9903 Segmental and somatic dysfunction of lumbar region: Secondary | ICD-10-CM | POA: Diagnosis not present

## 2023-06-03 DIAGNOSIS — M5432 Sciatica, left side: Secondary | ICD-10-CM | POA: Diagnosis not present

## 2023-06-03 DIAGNOSIS — M9905 Segmental and somatic dysfunction of pelvic region: Secondary | ICD-10-CM | POA: Diagnosis not present

## 2023-06-03 DIAGNOSIS — M5431 Sciatica, right side: Secondary | ICD-10-CM | POA: Diagnosis not present

## 2023-06-05 DIAGNOSIS — M9905 Segmental and somatic dysfunction of pelvic region: Secondary | ICD-10-CM | POA: Diagnosis not present

## 2023-06-05 DIAGNOSIS — M9903 Segmental and somatic dysfunction of lumbar region: Secondary | ICD-10-CM | POA: Diagnosis not present

## 2023-06-05 DIAGNOSIS — M5432 Sciatica, left side: Secondary | ICD-10-CM | POA: Diagnosis not present

## 2023-06-05 DIAGNOSIS — M5431 Sciatica, right side: Secondary | ICD-10-CM | POA: Diagnosis not present

## 2023-06-10 DIAGNOSIS — M5432 Sciatica, left side: Secondary | ICD-10-CM | POA: Diagnosis not present

## 2023-06-10 DIAGNOSIS — M5431 Sciatica, right side: Secondary | ICD-10-CM | POA: Diagnosis not present

## 2023-06-10 DIAGNOSIS — M9903 Segmental and somatic dysfunction of lumbar region: Secondary | ICD-10-CM | POA: Diagnosis not present

## 2023-06-10 DIAGNOSIS — M9905 Segmental and somatic dysfunction of pelvic region: Secondary | ICD-10-CM | POA: Diagnosis not present

## 2023-06-12 DIAGNOSIS — M9903 Segmental and somatic dysfunction of lumbar region: Secondary | ICD-10-CM | POA: Diagnosis not present

## 2023-06-12 DIAGNOSIS — M9905 Segmental and somatic dysfunction of pelvic region: Secondary | ICD-10-CM | POA: Diagnosis not present

## 2023-06-12 DIAGNOSIS — M5431 Sciatica, right side: Secondary | ICD-10-CM | POA: Diagnosis not present

## 2023-06-12 DIAGNOSIS — M5432 Sciatica, left side: Secondary | ICD-10-CM | POA: Diagnosis not present

## 2023-06-13 DIAGNOSIS — H31092 Other chorioretinal scars, left eye: Secondary | ICD-10-CM | POA: Diagnosis not present

## 2023-06-13 DIAGNOSIS — H33192 Other retinoschisis and retinal cysts, left eye: Secondary | ICD-10-CM | POA: Diagnosis not present

## 2023-06-13 DIAGNOSIS — H43823 Vitreomacular adhesion, bilateral: Secondary | ICD-10-CM | POA: Diagnosis not present

## 2023-06-13 DIAGNOSIS — H2513 Age-related nuclear cataract, bilateral: Secondary | ICD-10-CM | POA: Diagnosis not present

## 2023-06-18 DIAGNOSIS — M5431 Sciatica, right side: Secondary | ICD-10-CM | POA: Diagnosis not present

## 2023-06-18 DIAGNOSIS — M9905 Segmental and somatic dysfunction of pelvic region: Secondary | ICD-10-CM | POA: Diagnosis not present

## 2023-06-18 DIAGNOSIS — M9903 Segmental and somatic dysfunction of lumbar region: Secondary | ICD-10-CM | POA: Diagnosis not present

## 2023-06-18 DIAGNOSIS — M5432 Sciatica, left side: Secondary | ICD-10-CM | POA: Diagnosis not present

## 2023-06-24 DIAGNOSIS — M9905 Segmental and somatic dysfunction of pelvic region: Secondary | ICD-10-CM | POA: Diagnosis not present

## 2023-06-24 DIAGNOSIS — M5432 Sciatica, left side: Secondary | ICD-10-CM | POA: Diagnosis not present

## 2023-06-24 DIAGNOSIS — M9903 Segmental and somatic dysfunction of lumbar region: Secondary | ICD-10-CM | POA: Diagnosis not present

## 2023-06-24 DIAGNOSIS — M5431 Sciatica, right side: Secondary | ICD-10-CM | POA: Diagnosis not present

## 2023-07-01 DIAGNOSIS — M5431 Sciatica, right side: Secondary | ICD-10-CM | POA: Diagnosis not present

## 2023-07-01 DIAGNOSIS — M9903 Segmental and somatic dysfunction of lumbar region: Secondary | ICD-10-CM | POA: Diagnosis not present

## 2023-07-01 DIAGNOSIS — M5432 Sciatica, left side: Secondary | ICD-10-CM | POA: Diagnosis not present

## 2023-07-01 DIAGNOSIS — M9905 Segmental and somatic dysfunction of pelvic region: Secondary | ICD-10-CM | POA: Diagnosis not present

## 2023-07-08 DIAGNOSIS — M5432 Sciatica, left side: Secondary | ICD-10-CM | POA: Diagnosis not present

## 2023-07-08 DIAGNOSIS — M9905 Segmental and somatic dysfunction of pelvic region: Secondary | ICD-10-CM | POA: Diagnosis not present

## 2023-07-08 DIAGNOSIS — M5431 Sciatica, right side: Secondary | ICD-10-CM | POA: Diagnosis not present

## 2023-07-08 DIAGNOSIS — M9903 Segmental and somatic dysfunction of lumbar region: Secondary | ICD-10-CM | POA: Diagnosis not present

## 2023-07-15 DIAGNOSIS — M5432 Sciatica, left side: Secondary | ICD-10-CM | POA: Diagnosis not present

## 2023-07-15 DIAGNOSIS — M9903 Segmental and somatic dysfunction of lumbar region: Secondary | ICD-10-CM | POA: Diagnosis not present

## 2023-07-15 DIAGNOSIS — M5431 Sciatica, right side: Secondary | ICD-10-CM | POA: Diagnosis not present

## 2023-07-15 DIAGNOSIS — M9905 Segmental and somatic dysfunction of pelvic region: Secondary | ICD-10-CM | POA: Diagnosis not present

## 2023-07-22 DIAGNOSIS — M9903 Segmental and somatic dysfunction of lumbar region: Secondary | ICD-10-CM | POA: Diagnosis not present

## 2023-07-22 DIAGNOSIS — M5432 Sciatica, left side: Secondary | ICD-10-CM | POA: Diagnosis not present

## 2023-07-22 DIAGNOSIS — M5431 Sciatica, right side: Secondary | ICD-10-CM | POA: Diagnosis not present

## 2023-07-22 DIAGNOSIS — M9905 Segmental and somatic dysfunction of pelvic region: Secondary | ICD-10-CM | POA: Diagnosis not present

## 2023-08-05 DIAGNOSIS — M9905 Segmental and somatic dysfunction of pelvic region: Secondary | ICD-10-CM | POA: Diagnosis not present

## 2023-08-05 DIAGNOSIS — M9903 Segmental and somatic dysfunction of lumbar region: Secondary | ICD-10-CM | POA: Diagnosis not present

## 2023-08-05 DIAGNOSIS — M5431 Sciatica, right side: Secondary | ICD-10-CM | POA: Diagnosis not present

## 2023-08-05 DIAGNOSIS — M5432 Sciatica, left side: Secondary | ICD-10-CM | POA: Diagnosis not present

## 2023-08-21 DIAGNOSIS — J3489 Other specified disorders of nose and nasal sinuses: Secondary | ICD-10-CM | POA: Diagnosis not present

## 2023-08-21 DIAGNOSIS — J069 Acute upper respiratory infection, unspecified: Secondary | ICD-10-CM | POA: Diagnosis not present

## 2023-08-21 DIAGNOSIS — R058 Other specified cough: Secondary | ICD-10-CM | POA: Diagnosis not present

## 2023-08-21 DIAGNOSIS — J029 Acute pharyngitis, unspecified: Secondary | ICD-10-CM | POA: Diagnosis not present

## 2023-08-21 DIAGNOSIS — Z03818 Encounter for observation for suspected exposure to other biological agents ruled out: Secondary | ICD-10-CM | POA: Diagnosis not present

## 2023-08-21 DIAGNOSIS — R0981 Nasal congestion: Secondary | ICD-10-CM | POA: Diagnosis not present

## 2023-08-26 DIAGNOSIS — M9903 Segmental and somatic dysfunction of lumbar region: Secondary | ICD-10-CM | POA: Diagnosis not present

## 2023-08-26 DIAGNOSIS — M9905 Segmental and somatic dysfunction of pelvic region: Secondary | ICD-10-CM | POA: Diagnosis not present

## 2023-08-26 DIAGNOSIS — M5431 Sciatica, right side: Secondary | ICD-10-CM | POA: Diagnosis not present

## 2023-08-26 DIAGNOSIS — M5432 Sciatica, left side: Secondary | ICD-10-CM | POA: Diagnosis not present

## 2023-09-13 IMAGING — CR DG SACRUM/COCCYX 2+V
4 series · 4 of 4 positions shown · non-contrast
Comparison: 11/01/2021

CLINICAL DATA: Acute sacral/coccyx pain following fall 2 weeks ago.

EXAM:
SACRUM AND COCCYX - 2+ VIEW

[coccyx ap]
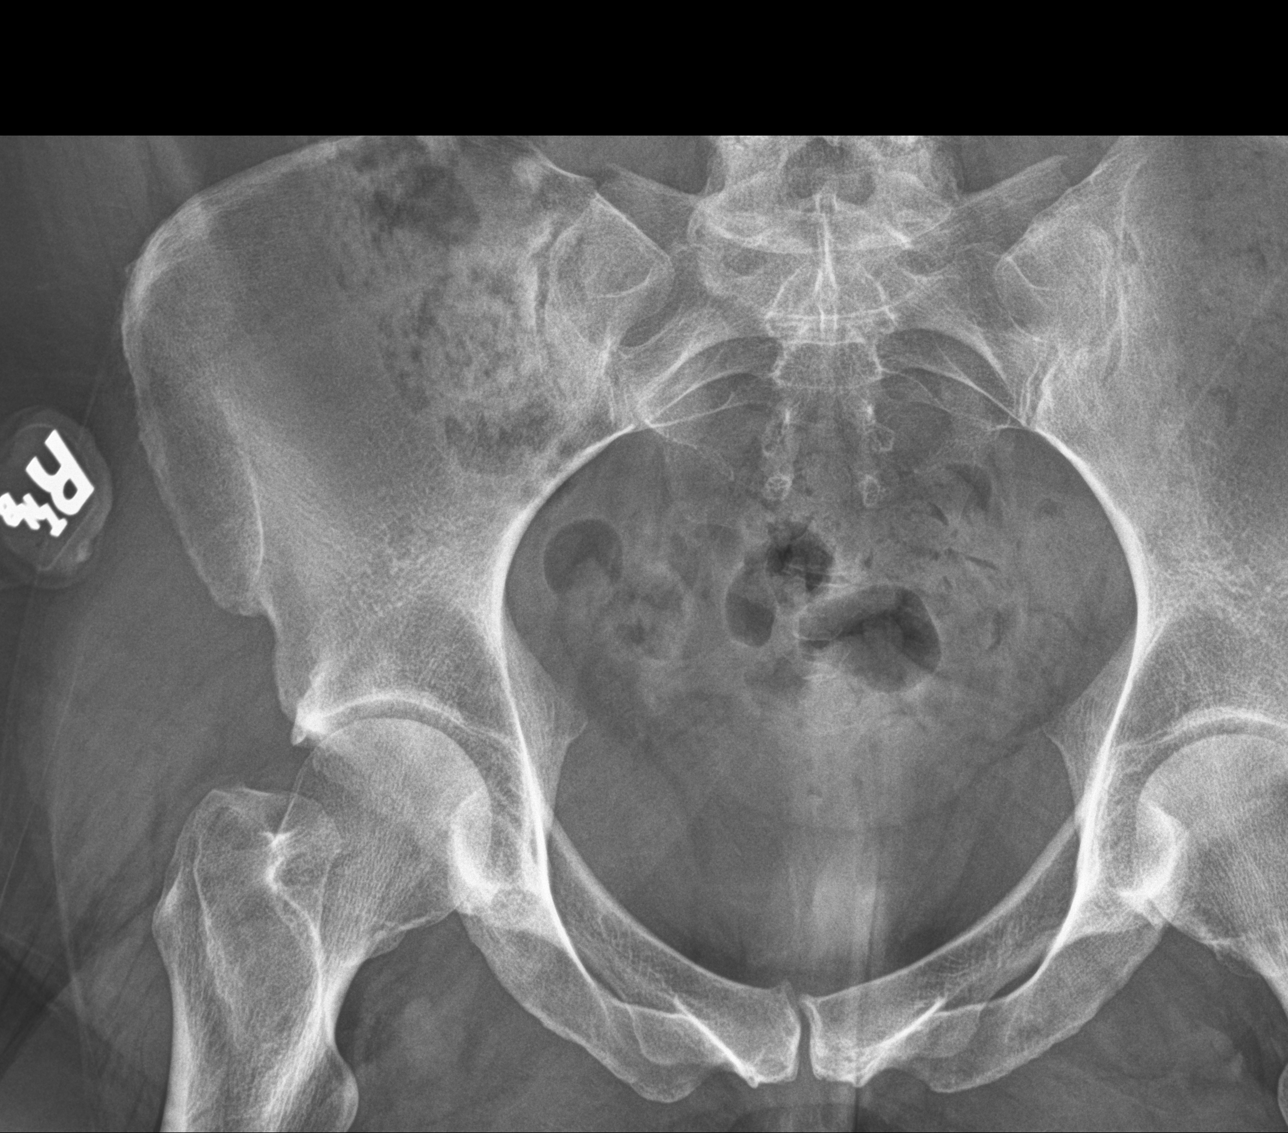

[sacrum lat (1 of 2)]
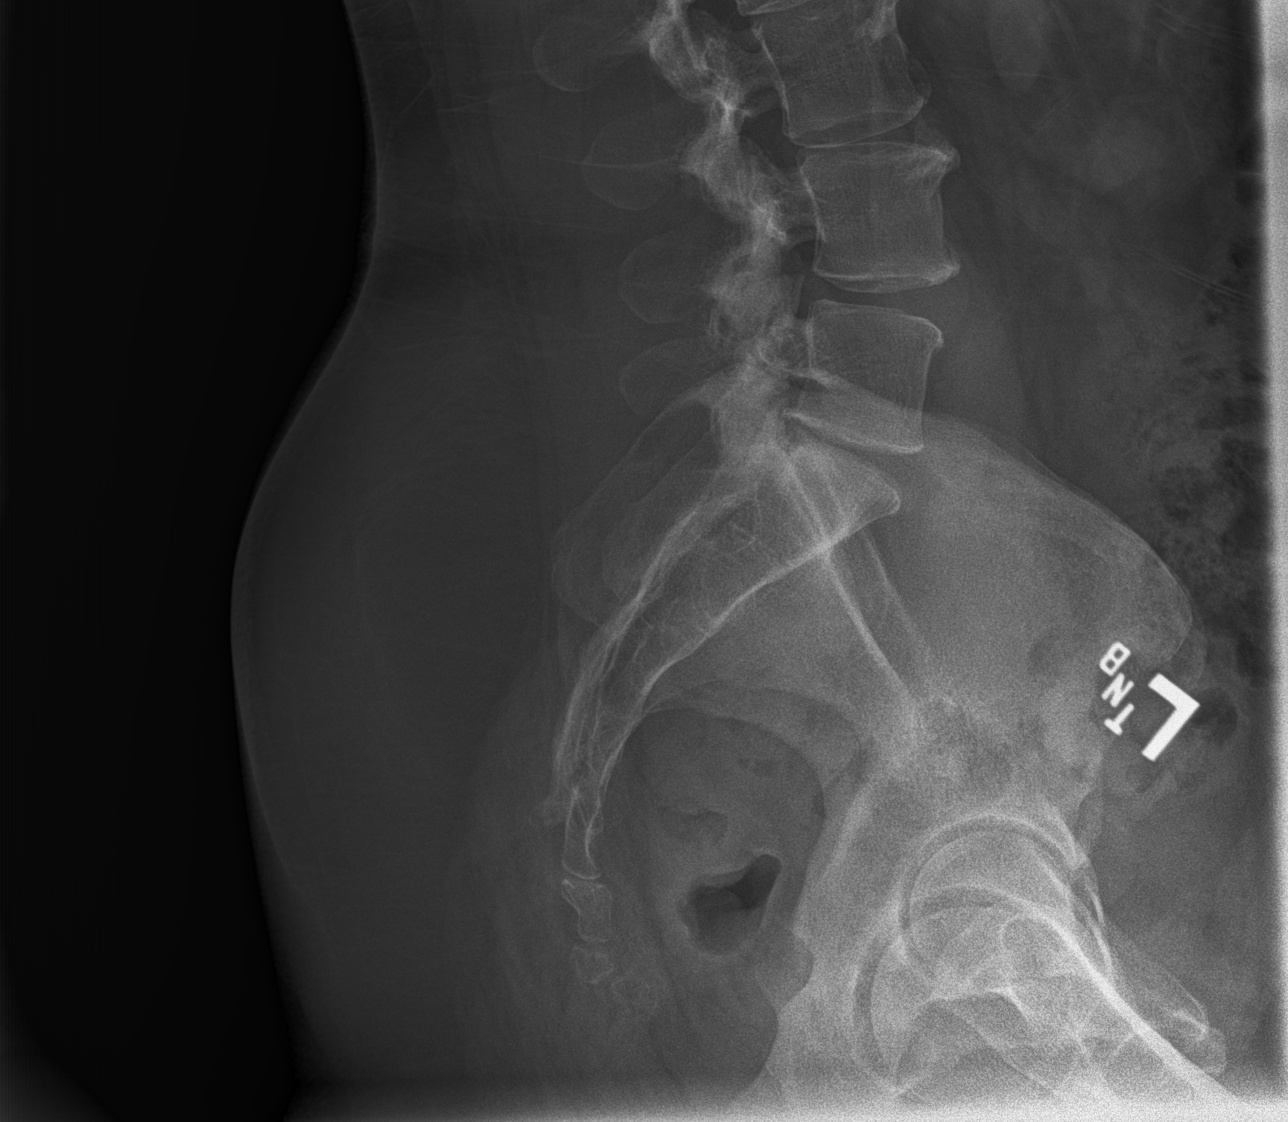

[sacrum ap]
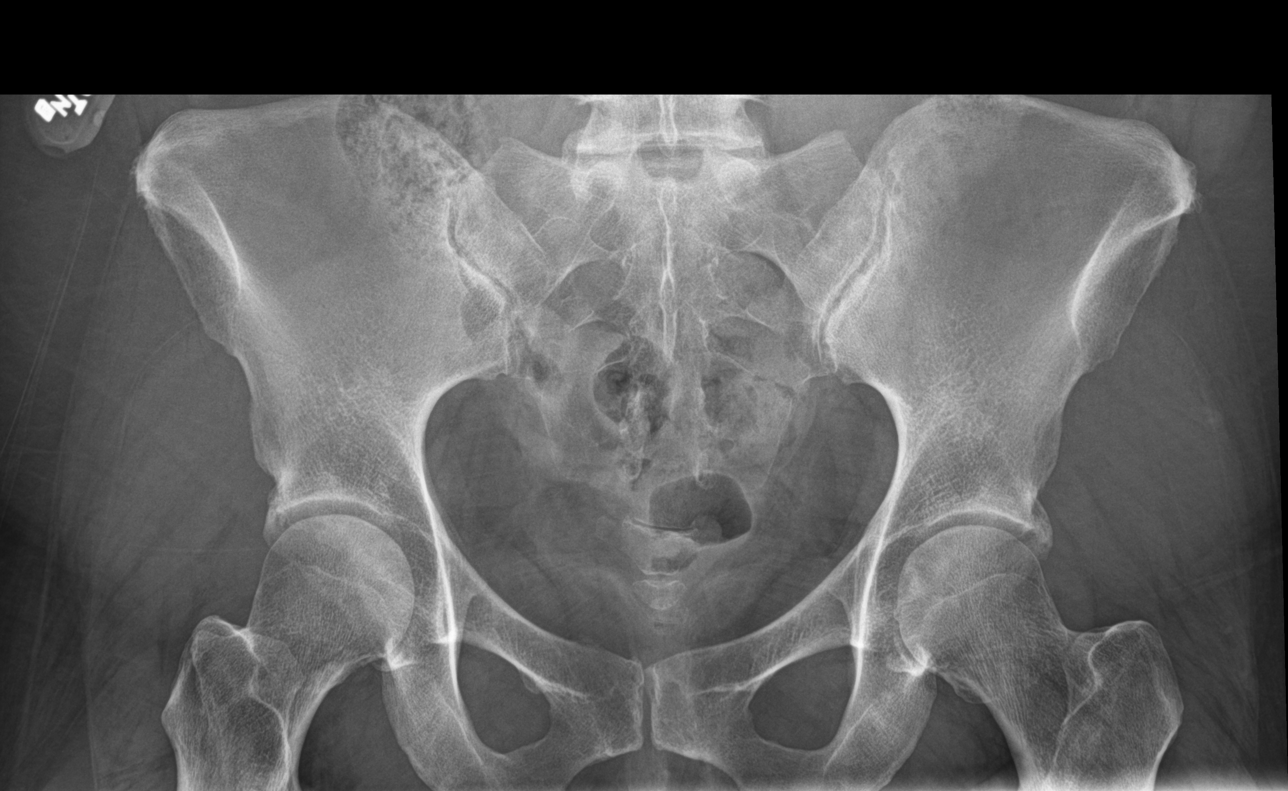

[sacrum lat (2 of 2)]
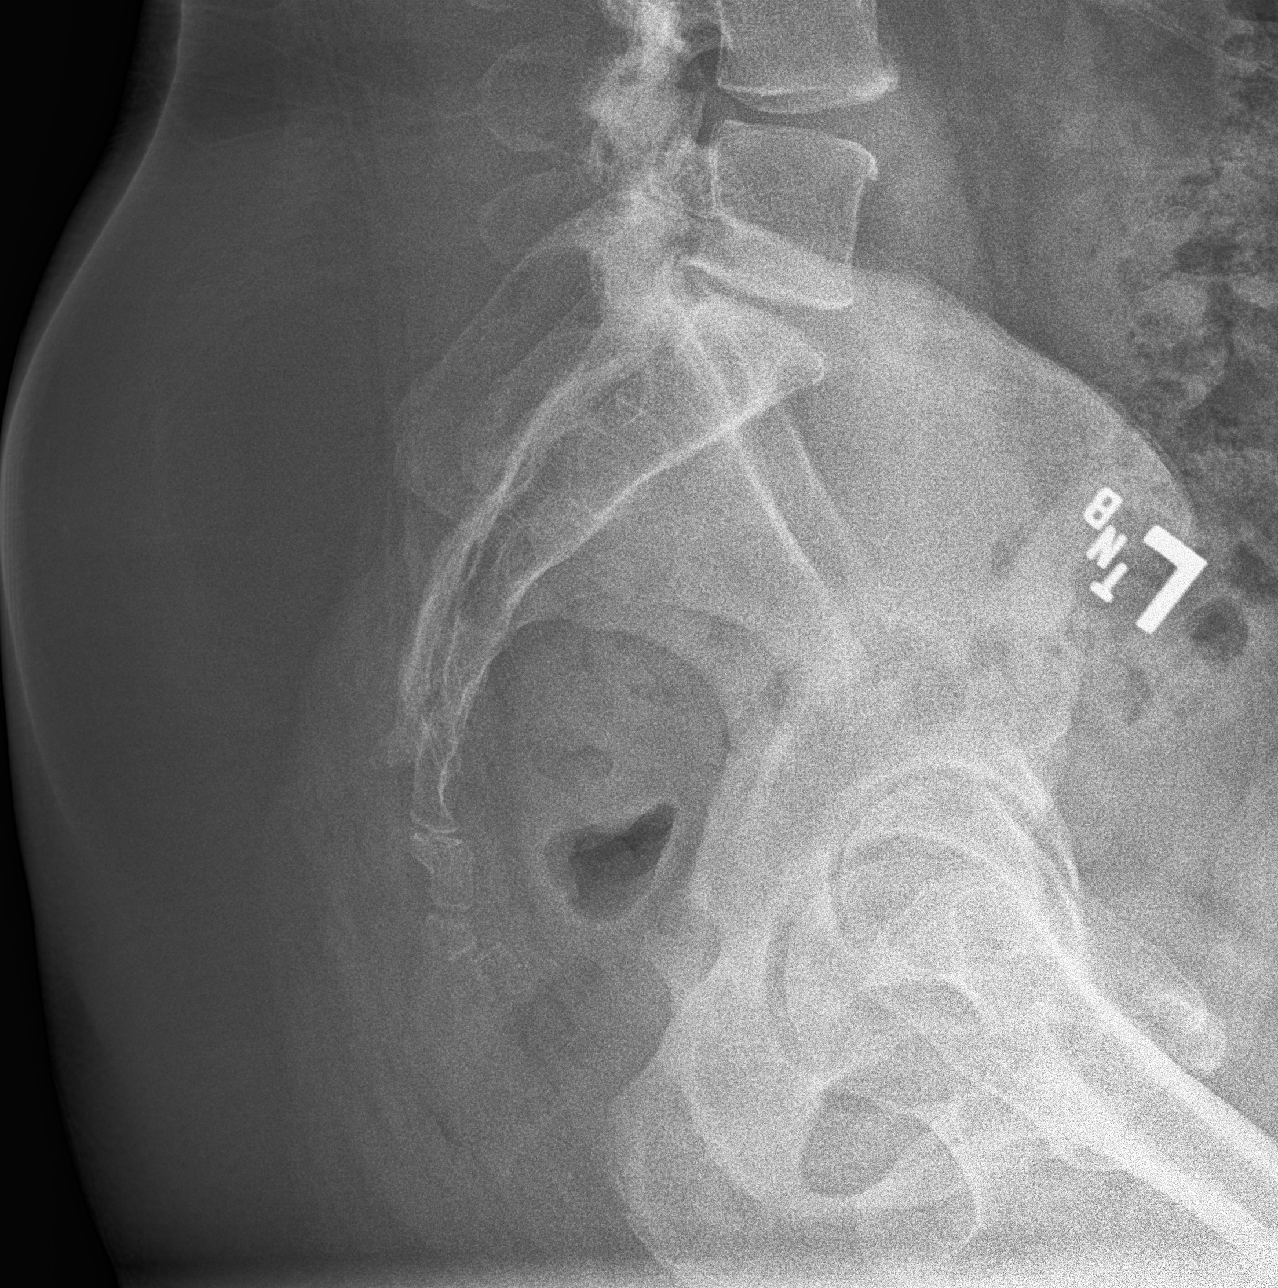

[4 of 4 positions shown; findings below may reference images not displayed]

FINDINGS: There is no evidence of fracture or other focal bone lesions. Mild
degenerative disc disease at L5-S1 noted.
IMPRESSION: No evidence of acute abnormality.

## 2023-09-16 DIAGNOSIS — M9903 Segmental and somatic dysfunction of lumbar region: Secondary | ICD-10-CM | POA: Diagnosis not present

## 2023-09-16 DIAGNOSIS — M5431 Sciatica, right side: Secondary | ICD-10-CM | POA: Diagnosis not present

## 2023-09-16 DIAGNOSIS — M9905 Segmental and somatic dysfunction of pelvic region: Secondary | ICD-10-CM | POA: Diagnosis not present

## 2023-09-16 DIAGNOSIS — M5432 Sciatica, left side: Secondary | ICD-10-CM | POA: Diagnosis not present

## 2023-09-30 DIAGNOSIS — R6889 Other general symptoms and signs: Secondary | ICD-10-CM | POA: Diagnosis not present

## 2023-09-30 DIAGNOSIS — M9905 Segmental and somatic dysfunction of pelvic region: Secondary | ICD-10-CM | POA: Diagnosis not present

## 2023-09-30 DIAGNOSIS — M5432 Sciatica, left side: Secondary | ICD-10-CM | POA: Diagnosis not present

## 2023-09-30 DIAGNOSIS — R5383 Other fatigue: Secondary | ICD-10-CM | POA: Diagnosis not present

## 2023-09-30 DIAGNOSIS — R7303 Prediabetes: Secondary | ICD-10-CM | POA: Diagnosis not present

## 2023-09-30 DIAGNOSIS — K760 Fatty (change of) liver, not elsewhere classified: Secondary | ICD-10-CM | POA: Diagnosis not present

## 2023-09-30 DIAGNOSIS — E282 Polycystic ovarian syndrome: Secondary | ICD-10-CM | POA: Diagnosis not present

## 2023-09-30 DIAGNOSIS — E538 Deficiency of other specified B group vitamins: Secondary | ICD-10-CM | POA: Diagnosis not present

## 2023-09-30 DIAGNOSIS — M5431 Sciatica, right side: Secondary | ICD-10-CM | POA: Diagnosis not present

## 2023-09-30 DIAGNOSIS — L659 Nonscarring hair loss, unspecified: Secondary | ICD-10-CM | POA: Diagnosis not present

## 2023-09-30 DIAGNOSIS — R102 Pelvic and perineal pain: Secondary | ICD-10-CM | POA: Diagnosis not present

## 2023-09-30 DIAGNOSIS — M9903 Segmental and somatic dysfunction of lumbar region: Secondary | ICD-10-CM | POA: Diagnosis not present

## 2023-09-30 DIAGNOSIS — Z0000 Encounter for general adult medical examination without abnormal findings: Secondary | ICD-10-CM | POA: Diagnosis not present

## 2023-10-06 ENCOUNTER — Other Ambulatory Visit: Payer: Self-pay | Admitting: Family Medicine

## 2023-10-06 DIAGNOSIS — R102 Pelvic and perineal pain: Secondary | ICD-10-CM

## 2023-10-14 ENCOUNTER — Ambulatory Visit
Admission: RE | Admit: 2023-10-14 | Discharge: 2023-10-14 | Disposition: A | Discharge status: Home / Self Care | Payer: 59 | Source: Ambulatory Visit | Attending: Family Medicine | Admitting: Family Medicine

## 2023-10-14 DIAGNOSIS — R102 Pelvic and perineal pain: Secondary | ICD-10-CM | POA: Diagnosis not present

## 2023-10-15 DIAGNOSIS — E538 Deficiency of other specified B group vitamins: Secondary | ICD-10-CM | POA: Diagnosis not present

## 2023-10-29 DIAGNOSIS — E538 Deficiency of other specified B group vitamins: Secondary | ICD-10-CM | POA: Diagnosis not present

## 2023-11-10 DIAGNOSIS — K760 Fatty (change of) liver, not elsewhere classified: Secondary | ICD-10-CM | POA: Diagnosis not present

## 2023-11-10 DIAGNOSIS — J9811 Atelectasis: Secondary | ICD-10-CM | POA: Diagnosis not present

## 2023-11-10 DIAGNOSIS — F419 Anxiety disorder, unspecified: Secondary | ICD-10-CM | POA: Diagnosis not present

## 2023-11-10 DIAGNOSIS — R0781 Pleurodynia: Secondary | ICD-10-CM | POA: Diagnosis not present

## 2023-11-10 DIAGNOSIS — K219 Gastro-esophageal reflux disease without esophagitis: Secondary | ICD-10-CM | POA: Diagnosis not present

## 2023-11-10 DIAGNOSIS — R9431 Abnormal electrocardiogram [ECG] [EKG]: Secondary | ICD-10-CM | POA: Diagnosis not present

## 2023-11-10 DIAGNOSIS — R071 Chest pain on breathing: Secondary | ICD-10-CM | POA: Diagnosis not present

## 2023-12-04 DIAGNOSIS — E538 Deficiency of other specified B group vitamins: Secondary | ICD-10-CM | POA: Diagnosis not present

## 2024-01-05 DIAGNOSIS — H6063 Unspecified chronic otitis externa, bilateral: Secondary | ICD-10-CM | POA: Diagnosis not present

## 2024-01-05 DIAGNOSIS — E538 Deficiency of other specified B group vitamins: Secondary | ICD-10-CM | POA: Diagnosis not present

## 2024-01-05 DIAGNOSIS — H6123 Impacted cerumen, bilateral: Secondary | ICD-10-CM | POA: Diagnosis not present

## 2024-02-06 DIAGNOSIS — R300 Dysuria: Secondary | ICD-10-CM | POA: Diagnosis not present

## 2024-02-06 DIAGNOSIS — E538 Deficiency of other specified B group vitamins: Secondary | ICD-10-CM | POA: Diagnosis not present

## 2024-02-06 DIAGNOSIS — R1030 Lower abdominal pain, unspecified: Secondary | ICD-10-CM | POA: Diagnosis not present

## 2024-03-08 DIAGNOSIS — E538 Deficiency of other specified B group vitamins: Secondary | ICD-10-CM | POA: Diagnosis not present

## 2024-04-08 DIAGNOSIS — E538 Deficiency of other specified B group vitamins: Secondary | ICD-10-CM | POA: Diagnosis not present

## 2024-05-10 DIAGNOSIS — E538 Deficiency of other specified B group vitamins: Secondary | ICD-10-CM | POA: Diagnosis not present
